# Patient Record
Sex: Female | Born: 1985 | Race: Black or African American | Hispanic: No | Marital: Single | Smoking: Never smoker
Health system: Southern US, Community
[De-identification: ages and names within clinical notes are randomized; demographics above are authoritative.]

## PROBLEM LIST (undated history)

## (undated) ENCOUNTER — Ambulatory Visit: Admission: EM | Payer: Managed Care, Other (non HMO)

## (undated) DIAGNOSIS — N809 Endometriosis, unspecified: Secondary | ICD-10-CM

## (undated) DIAGNOSIS — M069 Rheumatoid arthritis, unspecified: Secondary | ICD-10-CM

## (undated) DIAGNOSIS — J45909 Unspecified asthma, uncomplicated: Secondary | ICD-10-CM

## (undated) DIAGNOSIS — L409 Psoriasis, unspecified: Secondary | ICD-10-CM

## (undated) HISTORY — PX: DILATION AND CURETTAGE OF UTERUS: SHX78

## (undated) HISTORY — PX: ABDOMINAL SURGERY: SHX537

## (undated) HISTORY — PX: NO PAST SURGERIES: SHX2092

---

## 2016-09-12 DIAGNOSIS — L4050 Arthropathic psoriasis, unspecified: Secondary | ICD-10-CM | POA: Insufficient documentation

## 2016-09-12 DIAGNOSIS — M0579 Rheumatoid arthritis with rheumatoid factor of multiple sites without organ or systems involvement: Secondary | ICD-10-CM | POA: Insufficient documentation

## 2016-09-28 DIAGNOSIS — J4530 Mild persistent asthma, uncomplicated: Secondary | ICD-10-CM | POA: Insufficient documentation

## 2016-12-26 DIAGNOSIS — Z79899 Other long term (current) drug therapy: Secondary | ICD-10-CM | POA: Insufficient documentation

## 2017-04-02 DIAGNOSIS — F410 Panic disorder [episodic paroxysmal anxiety] without agoraphobia: Secondary | ICD-10-CM | POA: Insufficient documentation

## 2017-11-08 DIAGNOSIS — Z309 Encounter for contraceptive management, unspecified: Secondary | ICD-10-CM | POA: Insufficient documentation

## 2017-11-08 DIAGNOSIS — N912 Amenorrhea, unspecified: Secondary | ICD-10-CM | POA: Insufficient documentation

## 2017-12-09 DIAGNOSIS — R102 Pelvic and perineal pain: Secondary | ICD-10-CM | POA: Insufficient documentation

## 2019-03-03 ENCOUNTER — Ambulatory Visit
Admission: EM | Admit: 2019-03-03 | Discharge: 2019-03-03 | Disposition: A | Discharge status: Home / Self Care | Payer: Managed Care, Other (non HMO) | Attending: Family Medicine | Admitting: Family Medicine

## 2019-03-03 ENCOUNTER — Other Ambulatory Visit: Payer: Self-pay

## 2019-03-03 ENCOUNTER — Encounter: Payer: Self-pay | Admitting: Emergency Medicine

## 2019-03-03 DIAGNOSIS — R1032 Left lower quadrant pain: Secondary | ICD-10-CM | POA: Diagnosis not present

## 2019-03-03 DIAGNOSIS — R350 Frequency of micturition: Secondary | ICD-10-CM

## 2019-03-03 LAB — URINALYSIS, COMPLETE (UACMP) WITH MICROSCOPIC
Bilirubin Urine: NEGATIVE
Glucose, UA: NEGATIVE mg/dL
Hgb urine dipstick: NEGATIVE
Ketones, ur: NEGATIVE mg/dL
Leukocytes,Ua: NEGATIVE
Nitrite: NEGATIVE
Protein, ur: NEGATIVE mg/dL
Specific Gravity, Urine: 1.025 (ref 1.005–1.030)
pH: 7 (ref 5.0–8.0)

## 2019-03-03 MED ORDER — MELOXICAM 15 MG PO TABS: 15.0000 mg | ORAL_TABLET | Freq: Every day | ORAL | 0 refills | Status: DC | PRN

## 2019-03-03 NOTE — ED Provider Notes (Signed)
[facility]    CSN: [id number] Arrival date & time: 03/03/19  1200  History   Chief Complaint Chief Complaint  Patient presents with  . Urinary Frequency    APPT   HPI  33 year old female presents with urinary frequency.  Patient reports a 3-day history of urinary frequency.  Patient also reports left side pain.  Denies dysuria.  No fever.  No nausea or vomiting.  No known inciting factor.  Pain is mild currently.  No known exacerbating or relieving factors.  Patient is concerned that she may have a UTI.  No other complaints.  PMH, Surgical Hx, Family Hx, Social History reviewed and updated as below.  PMH: Arthritis (? RA), Psoriasis, Asthma, Anxiety  Past Surgical History:  Procedure Laterality Date  . NO PAST SURGERIES      OB History   No obstetric history on file.      Home Medications    Prior to Admission medications   Medication Sig Start Date End Date Taking? Authorizing Provider  albuterol (PROAIR HFA) 108 (90 Base) MCG/ACT inhaler ProAir HFA 90 mcg/actuation aerosol inhaler 11/08/17  Yes [provider]  meloxicam (MOBIC) 15 MG tablet Take 1 tablet (15 mg total) by mouth daily as needed for pain. 03/03/19   Coral Spikes, DO   Family History Family History  Problem Relation Age of Onset  . Asthma Mother   . Healthy Father    Social History Social History   Tobacco Use  . Smoking status: Never Smoker  . Smokeless tobacco: Never Used  Substance Use Topics  . Alcohol use: Not Currently  . Drug use: Not Currently     Allergies   Penicillins   Review of Systems Review of Systems  Gastrointestinal: Positive for abdominal pain.  Genitourinary: Positive for flank pain and urgency.   Physical Exam Triage Vital Signs ED Triage Vitals  Enc Vitals Group     BP 03/03/19 1216 118/85     Pulse Rate 03/03/19 1216 68     Resp 03/03/19 1216 18     Temp 03/03/19 1216 98 F (36.7 C)     Temp Source 03/03/19 1216 Oral     SpO2  03/03/19 1216 100 %     Weight 03/03/19 1212 158 lb (71.7 kg)     Height 03/03/19 1212 5\' 4"  (1.626 m)     Head Circumference --      Peak Flow --      Pain Score 03/03/19 1212 2     Pain Loc --      Pain Edu? --      Excl. in [place]? --    Updated Vital Signs BP 118/85 (BP Location: Left Arm)   Pulse 68   Temp 98 F (36.7 C) (Oral)   Resp 18   Ht 5\' 4"  (1.626 m)   Wt 71.7 kg   LMP 02/23/2019 (Approximate)   SpO2 100%   BMI 27.12 kg/m   Visual Acuity Right Eye Distance:   Left Eye Distance:   Bilateral Distance:    Right Eye Near:   Left Eye Near:    Bilateral Near:     Physical Exam Vitals signs and nursing note reviewed.  Constitutional:      General: She is not in acute distress.    Appearance: Normal appearance. She is normal weight.  HENT:     Head: Normocephalic and atraumatic.  Eyes:     General:        Right  eye: No discharge.        Left eye: No discharge.     Conjunctiva/sclera: Conjunctivae normal.  Cardiovascular:     Rate and Rhythm: Normal rate and regular rhythm.  Pulmonary:     Effort: Pulmonary effort is normal.     Breath sounds: Normal breath sounds. No wheezing, rhonchi or rales.  Abdominal:     General: There is no distension.     Palpations: Abdomen is soft.     Tenderness: There is no left CVA tenderness.     Comments: Mild tenderness in the left lower quadrant.  Neurological:     Mental Status: She is alert.  Psychiatric:        Mood and Affect: Mood normal.        Behavior: Behavior normal.    UC Treatments / Results  Labs (all labs ordered are listed, but only abnormal results are displayed) Labs Reviewed  URINALYSIS, COMPLETE (UACMP) WITH MICROSCOPIC - Abnormal; Notable for the following components:      Result Value   Bacteria, UA RARE (*)    All other components within normal limits    EKG   Radiology No results found.  Procedures Procedures (including critical care time)  Medications Ordered in UC Medications -  No data to display  Initial Impression / Assessment and Plan / UC Course  I have reviewed the triage vital signs and the nursing notes.  Pertinent labs & imaging results that were available during my care of the patient were reviewed by me and considered in my medical decision making (see chart for details).    33 year old female presents with left lower quadrant pain.  Patient also endorses urinary frequency.  Urinalysis negative.  Well-appearing.  Meloxicam as needed.  If persists, recommend ultrasound for further evaluation.  Final Clinical Impressions(s) / UC Diagnoses   Final diagnoses:  LLQ pain     Discharge Instructions     No evidence of UTI.  Rest.  Medication as needed.  If persists, recommend [place].  Take care  Dr. Lacinda Axon    ED Prescriptions    Medication Sig Dispense Auth. Provider   meloxicam (MOBIC) 15 MG tablet Take 1 tablet (15 mg total) by mouth daily as needed for pain. 30 tablet Coral Spikes, DO     Controlled Substance Prescriptions [place] Controlled Substance Registry consulted? Not Applicable   Coral Spikes, DO 03/03/19 1409

## 2019-03-03 NOTE — ED Triage Notes (Signed)
Pt c/o urinary frequency and left side pain. Started about 3 days ago. Denies dysuria or fever.

## 2019-03-03 NOTE — Discharge Instructions (Addendum)
No evidence of UTI.  Rest.  Medication as needed.  If persists, recommend [place].  Take care  Dr. Lacinda Axon

## 2019-03-21 ENCOUNTER — Ambulatory Visit
Admission: EM | Admit: 2019-03-21 | Discharge: 2019-03-21 | Disposition: A | Discharge status: Home / Self Care | Payer: Managed Care, Other (non HMO) | Attending: Emergency Medicine | Admitting: Emergency Medicine

## 2019-03-21 ENCOUNTER — Encounter: Payer: Self-pay | Admitting: Emergency Medicine

## 2019-03-21 ENCOUNTER — Other Ambulatory Visit: Payer: Self-pay

## 2019-03-21 DIAGNOSIS — N898 Other specified noninflammatory disorders of vagina: Secondary | ICD-10-CM | POA: Diagnosis not present

## 2019-03-21 DIAGNOSIS — N760 Acute vaginitis: Secondary | ICD-10-CM | POA: Diagnosis not present

## 2019-03-21 DIAGNOSIS — B9689 Other specified bacterial agents as the cause of diseases classified elsewhere: Secondary | ICD-10-CM

## 2019-03-21 LAB — URINALYSIS, COMPLETE (UACMP) WITH MICROSCOPIC
Bacteria, UA: NONE SEEN
Bilirubin Urine: NEGATIVE
Glucose, UA: NEGATIVE mg/dL
Hgb urine dipstick: NEGATIVE
Ketones, ur: NEGATIVE mg/dL
Leukocytes,Ua: NEGATIVE
Nitrite: NEGATIVE
Protein, ur: NEGATIVE mg/dL
RBC / HPF: NONE SEEN RBC/hpf (ref 0–5)
Specific Gravity, Urine: 1.025 (ref 1.005–1.030)
WBC, UA: NONE SEEN WBC/hpf (ref 0–5)
pH: 6.5 (ref 5.0–8.0)

## 2019-03-21 LAB — WET PREP, GENITAL
Sperm: NONE SEEN
Trich, Wet Prep: NONE SEEN
Yeast Wet Prep HPF POC: NONE SEEN

## 2019-03-21 LAB — PREGNANCY, URINE: Preg Test, Ur: NEGATIVE

## 2019-03-21 MED ORDER — CLINDAMYCIN PHOSPHATE 2 % VA CREA: 1.0000 | TOPICAL_CREAM | Freq: Every day | VAGINAL | 0 refills | Status: DC

## 2019-03-21 MED ORDER — KETOCONAZOLE 2 % EX CREA: 1.0000 "application " | TOPICAL_CREAM | Freq: Every day | CUTANEOUS | 0 refills | Status: DC

## 2019-03-21 MED ORDER — FLUCONAZOLE 150 MG PO TABS: ORAL_TABLET | ORAL | 0 refills | Status: DC

## 2019-03-21 NOTE — ED Triage Notes (Signed)
Patient states that she was treated for BV a week ago.  Patient c/o vaginal discharge and itching.

## 2019-03-21 NOTE — Discharge Instructions (Addendum)
If you are not improving recommend following up with OB/GYN.

## 2019-03-21 NOTE — ED Provider Notes (Signed)
[facility]    CSN: [id number] Arrival date & time: 03/21/19  1128      History   Chief Complaint Chief Complaint  Patient presents with  . Vaginal Discharge    HPI Sherry Villarreal is a 33 y.o. female.   HPI  33 year old female presents with vaginal discharge and itching that she has had since being treated for BV about a week ago.  She denies any symptoms of frequency or urgency but has noticed a whitish thick discharge with white clumps particularly after going to the bathroom.  She finished full course of Flagyl p.o. for the BV which improved but still has the symptoms as listed above.  She said no fever or chills.  She is sexually active with the same partner.  She has never had yeast infection before but feels very irritated and swollen in the genital area.         History reviewed. No pertinent past medical history.  There are no active problems to display for this patient.   Past Surgical History:  Procedure Laterality Date  . NO PAST SURGERIES      OB History   No obstetric history on file.      Home Medications    Prior to Admission medications   Medication Sig Start Date End Date Taking? Authorizing Provider  albuterol (PROAIR HFA) 108 (90 Base) MCG/ACT inhaler ProAir HFA 90 mcg/actuation aerosol inhaler 11/08/17   [provider]  clindamycin (CLEOCIN) 2 % vaginal cream Place 1 Applicatorful vaginally at bedtime. Use for 7 days 03/21/19   Lorin Picket, PA-C  fluconazole (DIFLUCAN) 150 MG tablet Take one tab for symptoms of yeast infection. Repeat x 1 in 72 hours. 03/21/19   Lorin Picket, PA-C  ketoconazole (NIZORAL) 2 % cream Apply 1 application topically daily. 03/21/19   Lorin Picket, PA-C    Family History Family History  Problem Relation Age of Onset  . Asthma Mother   . Healthy Father     Social History Social History   Tobacco Use  . Smoking status: Never Smoker  . Smokeless tobacco: Never Used   Substance Use Topics  . Alcohol use: Not Currently  . Drug use: Not Currently     Allergies   Penicillins   Review of Systems Review of Systems  Constitutional: Negative for activity change, appetite change, chills, fatigue and fever.  Genitourinary: Positive for vaginal discharge and vaginal pain. Negative for dysuria, frequency and urgency.  All other systems reviewed and are negative.    Physical Exam Triage Vital Signs ED Triage Vitals  Enc Vitals Group     BP 03/21/19 1218 103/65     Pulse Rate 03/21/19 1218 67     Resp 03/21/19 1218 14     Temp 03/21/19 1218 98.5 F (36.9 C)     Temp Source 03/21/19 1218 Oral     SpO2 03/21/19 1218 97 %     Weight 03/21/19 1214 154 lb (69.9 kg)     Height 03/21/19 1214 5\' 4"  (1.626 m)     Head Circumference --      Peak Flow --      Pain Score 03/21/19 1214 0     Pain Loc --      Pain Edu? --      Excl. in [place]? --    No data found.  Updated Vital Signs BP 103/65 (BP Location: Left Arm)   Pulse 67   Temp 98.5 F (36.9  C) (Oral)   Resp 14   Ht 5\' 4"  (1.626 m)   Wt 154 lb (69.9 kg)   LMP 02/23/2019 (Approximate)   SpO2 97%   BMI 26.43 kg/m   Visual Acuity Right Eye Distance:   Left Eye Distance:   Bilateral Distance:    Right Eye Near:   Left Eye Near:    Bilateral Near:     Physical Exam Vitals signs and nursing note reviewed.  Constitutional:      General: She is not in acute distress.    Appearance: Normal appearance. She is normal weight. She is not ill-appearing, toxic-appearing or diaphoretic.  HENT:     Head: Normocephalic and atraumatic.  Eyes:     Conjunctiva/sclera: Conjunctivae normal.  Neck:     Musculoskeletal: Normal range of motion and neck supple.  Cardiovascular:     Rate and Rhythm: Normal rate and regular rhythm.     Heart sounds: Normal heart sounds.  Pulmonary:     Effort: Pulmonary effort is normal.     Breath sounds: Normal breath sounds.  Abdominal:     General: Abdomen is  flat. Bowel sounds are normal. There is no distension.     Palpations: Abdomen is soft.     Tenderness: There is no abdominal tenderness. There is no guarding or rebound.  Genitourinary:    Comments: Patient perform self swabbing Musculoskeletal: Normal range of motion.  Skin:    General: Skin is warm and dry.  Neurological:     General: No focal deficit present.     Mental Status: She is alert and oriented to person, place, and time.  Psychiatric:        Mood and Affect: Mood normal.        Behavior: Behavior normal.        Thought Content: Thought content normal.        Judgment: Judgment normal.      UC Treatments / Results  Labs (all labs ordered are listed, but only abnormal results are displayed) Labs Reviewed  WET PREP, GENITAL - Abnormal; Notable for the following components:      Result Value   Clue Cells Wet Prep HPF POC PRESENT (*)    WBC, Wet Prep HPF POC RARE (*)    All other components within normal limits  URINALYSIS, COMPLETE (UACMP) WITH MICROSCOPIC  PREGNANCY, URINE    EKG   Radiology No results found.  Procedures Procedures (including critical care time)  Medications Ordered in UC Medications - No data to display  Initial Impression / Assessment and Plan / UC Course  I have reviewed the triage vital signs and the nursing notes.  Pertinent labs & imaging results that were available during my care of the patient were reviewed by me and considered in my medical decision making (see chart for details).   33 year old female presents with a vaginal discharge she explains as a white Rimi texture with white clumps after she urinates.  States that today while performing the self swab the alcohol caused her a great deal of pain in the genital area.  She was treated for BV about a week ago with a full course of Flagyl.  She states her symptoms have largely improved.  However wet prep today still showed clue cells present.  Her urine test was negative today.   Because of the continuing clue cells have told her we will use an intravaginal preparation, clindamycin for the next 7 days at nighttime.  In addition  I will also prescribed Diflucan for possible yeast infection and I provided her with the Nizoral cream for external use because of the irritation.  If she is not improving I recommended she follow-up with GYN.  She has an appointment for November but if she continues to have symptoms I would recommend that she see if she can move that appointment up.  Abstain from sex until after the treatment with the intravaginal clindamycin.   Final Clinical Impressions(s) / UC Diagnoses   Final diagnoses:  BV (bacterial vaginosis)  Vaginal discharge     Discharge Instructions     If you are not improving recommend following up with OB/GYN.    ED Prescriptions    Medication Sig Dispense Auth. Provider   clindamycin (CLEOCIN) 2 % vaginal cream Place 1 Applicatorful vaginally at bedtime. Use for 7 days 40 g Crecencio Mc P, PA-C   fluconazole (DIFLUCAN) 150 MG tablet Take one tab for symptoms of yeast infection. Repeat x 1 in 72 hours. 2 tablet Crecencio Mc P, PA-C   ketoconazole (NIZORAL) 2 % cream Apply 1 application topically daily. 15 g Lorin Picket, PA-C     PDMP not reviewed this encounter.   Lorin Picket, PA-C 03/21/19 1720

## 2019-04-16 ENCOUNTER — Emergency Department: Payer: Managed Care, Other (non HMO)

## 2019-04-16 ENCOUNTER — Encounter: Payer: Self-pay | Admitting: Emergency Medicine

## 2019-04-16 ENCOUNTER — Other Ambulatory Visit: Payer: Self-pay

## 2019-04-16 ENCOUNTER — Emergency Department
Admission: EM | Admit: 2019-04-16 | Discharge: 2019-04-16 | Disposition: A | Discharge status: Home / Self Care | Payer: Managed Care, Other (non HMO) | Attending: Emergency Medicine | Admitting: Emergency Medicine

## 2019-04-16 DIAGNOSIS — R102 Pelvic and perineal pain: Secondary | ICD-10-CM | POA: Diagnosis present

## 2019-04-16 DIAGNOSIS — N946 Dysmenorrhea, unspecified: Secondary | ICD-10-CM | POA: Diagnosis not present

## 2019-04-16 LAB — COMPREHENSIVE METABOLIC PANEL
ALT: 15 U/L (ref 0–44)
AST: 16 U/L (ref 15–41)
Albumin: 4.4 g/dL (ref 3.5–5.0)
Alkaline Phosphatase: 47 U/L (ref 38–126)
Anion gap: 9 (ref 5–15)
BUN: 11 mg/dL (ref 6–20)
CO2: 27 mmol/L (ref 22–32)
Calcium: 9.3 mg/dL (ref 8.9–10.3)
Chloride: 101 mmol/L (ref 98–111)
Creatinine, Ser: 0.65 mg/dL (ref 0.44–1.00)
GFR calc Af Amer: >60 mL/min (ref >60)
GFR calc non Af Amer: >60 mL/min (ref >60)
Glucose, Bld: 92 mg/dL (ref 70–99)
Potassium: 3.7 mmol/L (ref 3.5–5.1)
Sodium: 137 mmol/L (ref 135–145)
Total Bilirubin: 0.9 mg/dL (ref 0.3–1.2)
Total Protein: 7.6 g/dL (ref 6.5–8.1)

## 2019-04-16 LAB — POCT PREGNANCY, URINE: Preg Test, Ur: NEGATIVE

## 2019-04-16 LAB — URINALYSIS, COMPLETE (UACMP) WITH MICROSCOPIC
Bacteria, UA: NONE SEEN
Bilirubin Urine: NEGATIVE
Glucose, UA: NEGATIVE mg/dL
Ketones, ur: NEGATIVE mg/dL
Leukocytes,Ua: NEGATIVE
Nitrite: NEGATIVE
Protein, ur: NEGATIVE mg/dL
Specific Gravity, Urine: 1.026 (ref 1.005–1.030)
WBC, UA: NONE SEEN WBC/hpf (ref 0–5)
pH: 5 (ref 5.0–8.0)

## 2019-04-16 LAB — CBC
HCT: 37.1 % (ref 36.0–46.0)
Hemoglobin: 12.1 g/dL (ref 12.0–15.0)
MCH: 29.7 pg (ref 26.0–34.0)
MCHC: 32.6 g/dL (ref 30.0–36.0)
MCV: 91.2 fL (ref 80.0–100.0)
Platelets: 269 10*3/uL (ref 150–400)
RBC: 4.07 MIL/uL (ref 3.87–5.11)
RDW: 13.3 % (ref 11.5–15.5)
WBC: 10.6 10*3/uL — ABNORMAL HIGH (ref 4.0–10.5)
nRBC: 0 % (ref 0.0–0.2)

## 2019-04-16 LAB — LIPASE, BLOOD: Lipase: 30 U/L (ref 11–51)

## 2019-04-16 LAB — WET PREP, GENITAL
Sperm: NONE SEEN
Trich, Wet Prep: NONE SEEN
Yeast Wet Prep HPF POC: NONE SEEN

## 2019-04-16 MED ORDER — DIPHENHYDRAMINE HCL 25 MG PO CAPS
25.0000 mg | ORAL_CAPSULE | Freq: Once | ORAL | Status: AC
  Administered 2019-04-16: 25 mg via ORAL

## 2019-04-16 MED ORDER — MORPHINE SULFATE (PF) 4 MG/ML IV SOLN
4.0000 mg | Freq: Once | INTRAVENOUS | Status: DC
  Filled 2019-04-16: qty 1

## 2019-04-16 MED ORDER — SODIUM CHLORIDE 0.9% FLUSH
3.0000 mL | Freq: Once | INTRAVENOUS | Status: AC
  Administered 2019-04-16: 3 mL via INTRAVENOUS

## 2019-04-16 MED ORDER — OXYCODONE-ACETAMINOPHEN 5-325 MG PO TABS
1.0000 | ORAL_TABLET | Freq: Once | ORAL | Status: AC
  Administered 2019-04-16: 1 via ORAL
  Filled 2019-04-16: qty 1

## 2019-04-16 MED ORDER — DIPHENHYDRAMINE HCL 25 MG PO CAPS
ORAL_CAPSULE | ORAL | Status: AC
  Filled 2019-04-16: qty 1

## 2019-04-16 NOTE — ED Provider Notes (Signed)
[facility] Emergency Department Provider Note   ____________________________________________   First MD Initiated Contact with Patient 04/16/19 1823     (approximate)  I have reviewed the triage vital signs and the nursing notes.   HISTORY  Chief Complaint Abdominal Pain    HPI Sherry Villarreal is a 33 y.o. female with no significant past medical history who presents to the ED complaining of pelvic pain.  Patient reports she has had 1 to 2 days of increasing pain in her pelvic area.  She describes this as a throbbing that is not exacerbated or alleviated by anything.  She denies any associated vaginal discharge, but has begun to have bleeding which she associates with the onset of her menstrual period.  She denies any dysuria, hematuria, flank pain, diarrhea, vomiting, or fever.  She does state that her symptoms are worse when she goes to have a bowel movement.  She was recently treated for BV on 2 occasions and has been scheduled for ultrasound by her OB/GYN.  She spoke with her OB/GYN's office today, who referred her to the ED for further evaluation.        History reviewed. No pertinent past medical history.  There are no active problems to display for this patient.   Past Surgical History:  Procedure Laterality Date  . DILATION AND CURETTAGE OF UTERUS    . NO PAST SURGERIES      Prior to Admission medications   Medication Sig Start Date End Date Taking? Authorizing Provider  albuterol (PROAIR HFA) 108 (90 Base) MCG/ACT inhaler ProAir HFA 90 mcg/actuation aerosol inhaler 11/08/17   [provider]  clindamycin (CLEOCIN) 2 % vaginal cream Place 1 Applicatorful vaginally at bedtime. Use for 7 days 03/21/19   Lorin Picket, PA-C  fluconazole (DIFLUCAN) 150 MG tablet Take one tab for symptoms of yeast infection. Repeat x 1 in 72 hours. 03/21/19   Lorin Picket, PA-C  ketoconazole (NIZORAL) 2 % cream Apply 1 application topically daily.  03/21/19   Lorin Picket, PA-C    Allergies Penicillins  Family History  Problem Relation Age of Onset  . Asthma Mother   . Healthy Father     Social History Social History   Tobacco Use  . Smoking status: Never Smoker  . Smokeless tobacco: Never Used  Substance Use Topics  . Alcohol use: Not Currently  . Drug use: Not Currently    Review of Systems  Constitutional: No fever/chills Eyes: No visual changes. ENT: No sore throat. Cardiovascular: Denies chest pain. Respiratory: Denies shortness of breath. Gastrointestinal: No abdominal pain.  No nausea, no vomiting.  No diarrhea.  No constipation. Genitourinary: Negative for dysuria.  Positive for pelvic pain.  Positive for vaginal bleeding. Musculoskeletal: Negative for back pain. Skin: Negative for rash. Neurological: Negative for headaches, focal weakness or numbness.  ____________________________________________   PHYSICAL EXAM:  VITAL SIGNS: ED Triage Vitals  Enc Vitals Group     BP 04/16/19 1748 124/62     Pulse Rate 04/16/19 1748 66     Resp 04/16/19 1748 18     Temp 04/16/19 1748 98.9 F (37.2 C)     Temp Source 04/16/19 1748 Oral     SpO2 04/16/19 1748 100 %     Weight --      Height 04/16/19 1736 5\' 4"  (1.626 m)     Head Circumference --      Peak Flow --      Pain Score 04/16/19 1736  7     Pain Loc --      Pain Edu? --      Excl. in [place]? --     Constitutional: Alert and oriented. Eyes: Conjunctivae are normal. Head: Atraumatic. Nose: No congestion/rhinnorhea. Mouth/Throat: Mucous membranes are moist. Neck: Normal ROM Cardiovascular: Normal rate, regular rhythm. Grossly normal heart sounds. Respiratory: Normal respiratory effort.  No retractions. Lungs CTAB. Gastrointestinal: Soft and nontender. No distention. Genitourinary: Small amount of blood in the vaginal vault, cervix unremarkable.  Right adnexal tenderness noted, otherwise no cervical motion or left adnexal tenderness.  Musculoskeletal: No lower extremity tenderness nor edema. Neurologic:  Normal speech and language. No gross focal neurologic deficits are appreciated. Skin:  Skin is warm, dry and intact. No rash noted. Psychiatric: Mood and affect are normal. Speech and behavior are normal.  ____________________________________________   LABS (all labs ordered are listed, but only abnormal results are displayed)  Labs Reviewed  WET PREP, GENITAL - Abnormal; Notable for the following components:      Result Value   Clue Cells Wet Prep HPF POC PRESENT (*)    WBC, Wet Prep HPF POC FEW (*)    All other components within normal limits  CBC - Abnormal; Notable for the following components:   WBC 10.6 (*)    All other components within normal limits  URINALYSIS, COMPLETE (UACMP) WITH MICROSCOPIC - Abnormal; Notable for the following components:   Color, Urine YELLOW (*)    APPearance CLEAR (*)    Hgb urine dipstick SMALL (*)    All other components within normal limits  LIPASE, BLOOD  COMPREHENSIVE METABOLIC PANEL  POC URINE PREG, ED  POCT PREGNANCY, URINE    PROCEDURES  Procedure(s) performed (including Critical Care):  Procedures   ____________________________________________   INITIAL IMPRESSION / ASSESSMENT AND PLAN / ED COURSE       33 year old female presenting to the ED for worsening pelvic pain over the past 1 to 2 days occurring with the onset of her menses and exacerbated with bowel movements.  She has no abdominal tenderness noted on exam, doubt appendicitis or other acute surgical pathology.  Pelvic exam shows small amount of blood consistent with menses, no discharge noted.  She does have some right adnexal tenderness, but no cervical motion or left adnexal tenderness.  Per prior note from her OB/GYN, this is similar to her prior exam.  Ultrasound was obtained and negative for fibroids or other acute process, no evidence of torsion.  Given chronicity of symptoms, doubt intermittent  torsion.  Given her description as well as association with bowel movements, she would benefit from further work-up for endometriosis.  Counseled patient to use Tylenol and ibuprofen as needed, follow-up with her OB/GYN, and return to the ED for new or worsening symptoms.  Patient agrees with plan.      ____________________________________________   FINAL CLINICAL IMPRESSION(S) / ED DIAGNOSES  Final diagnoses:  Pelvic pain  Dysmenorrhea     ED Discharge Orders    None       Note:  This document was prepared using Dragon voice recognition software and may include unintentional dictation errors.   Blake Divine, MD 04/16/19 [phone]

## 2019-04-16 NOTE — ED Notes (Signed)
Po pain meds given for pt.  Pt reports itching with percocet.  md aware.  Benadryl given with percocet.  Pt alert and texting on phone.

## 2019-04-16 NOTE — ED Notes (Signed)
Pt signed esignature.  D/c  inst to pt.  

## 2019-04-16 NOTE — ED Triage Notes (Signed)
First RN Note: Pt presents to ED via POV, states having lower abdominal pain and was referred by OBGYN. Per notes from OBGYN notes, pt having pelvic pain and feeling like she is constipated, is unable to put a tampon in due to pelvic pain.

## 2019-04-16 NOTE — ED Notes (Signed)
REPORTS lower abdominal pain that started today-reports hx of same. Denies NVD. No increased tenderness with palpation. Pt alert and oriented X4, cooperative, RR even and unlabored, color WNL. Pt in NAD.

## 2019-04-16 NOTE — ED Notes (Signed)
Patient transported to Ultrasound 

## 2019-06-23 ENCOUNTER — Encounter: Payer: Self-pay | Admitting: Emergency Medicine

## 2019-06-23 ENCOUNTER — Ambulatory Visit
Admission: EM | Admit: 2019-06-23 | Discharge: 2019-06-23 | Disposition: A | Discharge status: Home / Self Care | Payer: Managed Care, Other (non HMO) | Attending: Emergency Medicine | Admitting: Emergency Medicine

## 2019-06-23 ENCOUNTER — Other Ambulatory Visit: Payer: Self-pay

## 2019-06-23 DIAGNOSIS — R110 Nausea: Secondary | ICD-10-CM

## 2019-06-23 DIAGNOSIS — R0981 Nasal congestion: Secondary | ICD-10-CM

## 2019-06-23 DIAGNOSIS — Z760 Encounter for issue of repeat prescription: Secondary | ICD-10-CM | POA: Insufficient documentation

## 2019-06-23 DIAGNOSIS — J45909 Unspecified asthma, uncomplicated: Secondary | ICD-10-CM | POA: Insufficient documentation

## 2019-06-23 DIAGNOSIS — Z20822 Contact with and (suspected) exposure to covid-19: Secondary | ICD-10-CM | POA: Insufficient documentation

## 2019-06-23 DIAGNOSIS — R519 Headache, unspecified: Secondary | ICD-10-CM

## 2019-06-23 DIAGNOSIS — R05 Cough: Secondary | ICD-10-CM | POA: Diagnosis not present

## 2019-06-23 MED ORDER — IBUPROFEN 600 MG PO TABS: 600.0000 mg | ORAL_TABLET | Freq: Four times a day (QID) | ORAL | 0 refills | Status: DC | PRN

## 2019-06-23 MED ORDER — FLUTICASONE PROPIONATE 50 MCG/ACT NA SUSP: 2.0000 | Freq: Every day | NASAL | 0 refills | Status: DC

## 2019-06-23 MED ORDER — AEROCHAMBER PLUS MISC: 2 refills | Status: AC

## 2019-06-23 MED ORDER — ALBUTEROL SULFATE HFA 108 (90 BASE) MCG/ACT IN AERS: 1.0000 | INHALATION_SPRAY | Freq: Four times a day (QID) | RESPIRATORY_TRACT | 0 refills | Status: AC | PRN

## 2019-06-23 MED ORDER — MONTELUKAST SODIUM 10 MG PO TABS: 10.0000 mg | ORAL_TABLET | Freq: Every day | ORAL | 1 refills | Status: DC

## 2019-06-23 NOTE — Discharge Instructions (Addendum)
Covid PCR sent-we will return in 18 to 24 hours.  Start Singulair, take regular albuterol for the next 4 days with your spacer.  2 puffs every 4 hours for 2 days, then every 6 hours for 2 days, then as needed.  Ibuprofen 600 mg combined with 1 g of Tylenol for headache.  Push electrolyte containing fluids such as Pedialyte or Gatorade.  Flonase, saline nasal irrigation with a Milta Deiters med sinus rinse and distilled water as often as you want as needed for the nasal congestion.

## 2019-06-23 NOTE — ED Triage Notes (Signed)
Pt c/o headache, nasal congestion, cough. Started about 2 days ago. She was exposed to covid about 12 days ago then again about a week ago.

## 2019-06-23 NOTE — ED Provider Notes (Signed)
HPI  SUBJECTIVE:  Sherry Villarreal is a 34 y.o. female who presents for Covid testing. She reports 2 days of headache, clear nasal congestion, rhinorrhea, fatigue, cough, mild shortness of breath, nausea. She reports chest tightness starting earlier today. Some photophobia. No sinus pain or pressure, neck stiffness, rash. She states that her allergies are bothering her. No fevers, body aches, sore throat, loss of sense of taste or smell, wheezing. No vomiting, diarrhea, abdominal pain. She was last exposed to Covid 1 week ago today. No aggravating or alleviating factors. She has not tried anything for this. She did not get a flu shot this year. She is currently on MetroGel for BV. No other recent antibiotics. No antipyretic in the past 4 to 6 hours. She has a past medical history of asthma, allergies, frequent sinusitis. She is not a smoker. She is not taking anything for her allergies. She  has run out of her Singulair and albuterol. She has a past medical history of psoriasis and rheumatoid arthritis currently on methotrexate. No history of diabetes, coronary disease, hypertension, chronic kidney disease, cancer, HIV. LMP: Denies the possibility of being pregnant. PMD: Dr. Gayla Medicus.   History reviewed. No pertinent past medical history.  Past Surgical History:  Procedure Laterality Date  . DILATION AND CURETTAGE OF UTERUS    . NO PAST SURGERIES      Family History  Problem Relation Age of Onset  . Asthma Mother   . Healthy Father     Social History   Tobacco Use  . Smoking status: Never Smoker  . Smokeless tobacco: Never Used  Substance Use Topics  . Alcohol use: Not Currently  . Drug use: Not Currently    No current facility-administered medications for this encounter.  Current Outpatient Medications:  .  folic acid (FOLVITE) 1 MG tablet, Take 1 mg by mouth daily., Disp: , Rfl:  .  methotrexate (RHEUMATREX) 2.5 MG tablet, methotrexate sodium 2.5 mg tablet, Disp: , Rfl:  .   albuterol (VENTOLIN HFA) 108 (90 Base) MCG/ACT inhaler, Inhale 1-2 puffs into the lungs every 6 (six) hours as needed for wheezing or shortness of breath., Disp: 18 g, Rfl: 0 .  fluticasone (FLONASE) 50 MCG/ACT nasal spray, Place 2 sprays into both nostrils daily., Disp: 16 g, Rfl: 0 .  ibuprofen (ADVIL) 600 MG tablet, Take 1 tablet (600 mg total) by mouth every 6 (six) hours as needed., Disp: 30 tablet, Rfl: 0 .  montelukast (SINGULAIR) 10 MG tablet, Take 1 tablet (10 mg total) by mouth at bedtime., Disp: 30 tablet, Rfl: 1 .  Spacer/Aero-Holding Chambers (AEROCHAMBER PLUS) inhaler, Use as instructed, Disp: 1 each, Rfl: 2  Allergies  Allergen Reactions  . Penicillins Other (See Comments) and Rash    Sores on scalp      ROS  As noted in HPI.   Physical Exam  BP 109/68 (BP Location: Left Arm)   Pulse 71   Temp 98.4 F (36.9 C) (Oral)   Resp 18   Ht 5\' 4"  (1.626 m)   Wt 68.5 kg   LMP 06/09/2019   SpO2 100%   BMI 25.92 kg/m   Constitutional: Well developed, well nourished, no acute distress Eyes:  EOMI, conjunctiva normal bilaterally HENT: Normocephalic, atraumatic,mucus membranes moist.  TMs normal bilaterally.  Erythematous, swollen turbinates.  No nasal congestion.  No maxillary or frontal sinus tenderness.  Normal oropharynx.  Enlarged tonsils without erythema or exudates.  Uvula midline. Neck: No cervical lymphadenopathy, meningismus Respiratory: Normal inspiratory effort, lungs  clear bilaterally.  Good air movement.  No chest wall tenderness. Cardiovascular: Normal rate regular rhythm no murmurs rubs or gallops GI: nondistended skin: No rash, skin intact Musculoskeletal: no deformities Neurologic: Alert & oriented x 3, no focal neuro deficits Psychiatric: Speech and behavior appropriate   ED Course   Medications - No data to display  Orders Placed This Encounter  Procedures  . Novel Coronavirus, NAA (Hosp order, Send-out to Ref Lab; TAT 18-24 hrs    Standing  Status:   Standing    Number of Occurrences:   1    Order Specific Question:   Is this test for diagnosis or screening    Answer:   Diagnosis of ill patient    Order Specific Question:   Symptomatic for COVID-19 as defined by CDC    Answer:   Yes    Order Specific Question:   Date of Symptom Onset    Answer:   06/21/2019    Order Specific Question:   Hospitalized for COVID-19    Answer:   Yes    Order Specific Question:   Admitted to ICU for COVID-19    Answer:   No    Order Specific Question:   Previously tested for COVID-19    Answer:   Yes    Order Specific Question:   Resident in a congregate (group) care setting    Answer:   No    Order Specific Question:   Employed in healthcare setting    Answer:   No    Order Specific Question:   Pregnant    Answer:   No    No results found for this or any previous visit (from the past 24 hour(s)). No results found.  ED Clinical Impression  1. Suspected COVID-19 virus infection   2. Uncomplicated asthma, unspecified asthma severity, unspecified whether persistent   3. Medication refill      ED Assessment/Plan  Patient with Covid exposure.  Suspect Covid versus URI.  Covid PCR sent.  Asthma also seems to be bothering her.  Will refill her Singulair, albuterol and prescribe her spacer.  She is to do regularly scheduled albuterol for the next 4 days, 2 puffs every 4 hours for 2 days, then every 6 hours for 2 days, then as needed.  Ibuprofen 600 mg combined with 1 g of Tylenol for headache.  Push electrolyte containing fluids.  Flonase, saline nasal irrigation as needed for the nasal congestion.  Follow-up with PMD as needed, to the ER if she gets worse.  Discussed MDM, treatment plan, and plan for follow-up with patient. Discussed sn/sx that should prompt return to the ED. patient agrees with plan.   Meds ordered this encounter  Medications  . montelukast (SINGULAIR) 10 MG tablet    Sig: Take 1 tablet (10 mg total) by mouth at bedtime.     Dispense:  30 tablet    Refill:  1  . fluticasone (FLONASE) 50 MCG/ACT nasal spray    Sig: Place 2 sprays into both nostrils daily.    Dispense:  16 g    Refill:  0  . Spacer/Aero-Holding Chambers (AEROCHAMBER PLUS) inhaler    Sig: Use as instructed    Dispense:  1 each    Refill:  2  . albuterol (VENTOLIN HFA) 108 (90 Base) MCG/ACT inhaler    Sig: Inhale 1-2 puffs into the lungs every 6 (six) hours as needed for wheezing or shortness of breath.    Dispense:  18 g  Refill:  0  . ibuprofen (ADVIL) 600 MG tablet    Sig: Take 1 tablet (600 mg total) by mouth every 6 (six) hours as needed.    Dispense:  30 tablet    Refill:  0    *This clinic note was created using [employer]. Therefore, there may be occasional mistakes despite careful proofreading.   ?    Melynda Ripple, MD 06/23/19 2012

## 2019-06-24 LAB — NOVEL CORONAVIRUS, NAA (HOSP ORDER, SEND-OUT TO REF LAB; TAT 18-24 HRS): SARS-CoV-2, NAA: NOT DETECTED

## 2019-10-16 DIAGNOSIS — G8929 Other chronic pain: Secondary | ICD-10-CM | POA: Insufficient documentation

## 2019-11-05 DIAGNOSIS — M542 Cervicalgia: Secondary | ICD-10-CM | POA: Insufficient documentation

## 2019-11-06 ENCOUNTER — Other Ambulatory Visit: Payer: Self-pay

## 2019-11-06 ENCOUNTER — Ambulatory Visit (INDEPENDENT_AMBULATORY_CARE_PROVIDER_SITE_OTHER): Payer: Managed Care, Other (non HMO) | Admitting: Podiatry

## 2019-11-06 DIAGNOSIS — L600 Ingrowing nail: Secondary | ICD-10-CM

## 2019-11-06 MED ORDER — GENTAMICIN SULFATE 0.1 % EX CREA: 1.0000 "application " | TOPICAL_CREAM | Freq: Two times a day (BID) | CUTANEOUS | 1 refills | Status: DC

## 2019-11-06 MED ORDER — DIAZEPAM 10 MG PO TABS: 10.0000 mg | ORAL_TABLET | Freq: Two times a day (BID) | ORAL | 0 refills | Status: DC | PRN

## 2019-11-06 NOTE — Patient Instructions (Signed)

## 2019-11-09 NOTE — Progress Notes (Signed)
   Subjective: Patient presents today for evaluation of gradually worsening pain to the medial border of the bilateral great toes that began about 6 months ago. Patient is concerned for possible ingrown nail and reports a h/o of them two years ago. Walking increases the pain. She has been trimming the nails and getting pedicures for treatment. Patient presents today for further treatment and evaluation.  No past medical history on file.  Objective:  General: Well developed, nourished, in no acute distress, alert and oriented x3   Dermatology: Skin is warm, dry and supple bilateral. Medial borders of the bilateral great toes appears to be erythematous with evidence of an ingrowing nail. Pain on palpation noted to the border of the nail fold. The remaining nails appear unremarkable at this time. There are no open sores, lesions.  Vascular: Dorsalis Pedis artery and Posterior Tibial artery pedal pulses palpable. No lower extremity edema noted.   Neruologic: Grossly intact via light touch bilateral.  Musculoskeletal: Muscular strength within normal limits in all groups bilateral. Normal range of motion noted to all pedal and ankle joints.   Assesement: #1 Paronychia with ingrowing nail medial borders bilateral great toes  #2 Pain in toe #3 Incurvated nail  Plan of Care:  1. Patient evaluated.  2. Discussed treatment alternatives and plan of care. Explained nail avulsion procedure and post procedure course to patient. 3. Patient opted for permanent partial nail avulsion of the medial border of the left hallux.  4. Prior to procedure, local anesthesia infiltration utilized using 3 ml of a 50:50 mixture of 2% plain lidocaine and 0.5% plain marcaine in a normal hallux block fashion and a betadine prep performed.  5. Partial permanent nail avulsion with chemical matrixectomy performed using [id number] applications of phenol followed by alcohol flush.  6. Light dressing applied. 7. Prescription for  Gentamicin cream provided to patient to use daily with a bandage.  8. Prescription for Valium 10 mg provided to patient to take just prior to next appointment.  9. Return to clinic in 2 weeks to address medial border right hallux.  Patient has high anxiety.   Edrick Kins, DPM [facility]  Dr. Edrick Kins, [address]                                        [place], [place] [address]                Office [phone]  Fax [phone]

## 2019-11-24 ENCOUNTER — Ambulatory Visit (INDEPENDENT_AMBULATORY_CARE_PROVIDER_SITE_OTHER): Payer: Managed Care, Other (non HMO) | Admitting: Podiatry

## 2019-11-24 ENCOUNTER — Other Ambulatory Visit: Payer: Self-pay

## 2019-11-24 ENCOUNTER — Encounter: Payer: Self-pay | Admitting: Podiatry

## 2019-11-24 DIAGNOSIS — L600 Ingrowing nail: Secondary | ICD-10-CM | POA: Diagnosis not present

## 2019-11-24 NOTE — Progress Notes (Signed)
   Subjective: Patient presents today for evaluation of pain to the medial border of the right great toe.  Patient was last seen here in the office approximately 2 weeks ago where she had partial permanent nail avulsion performed to the medial border of the left great toe.  She states that is doing very well.  Minimal pain.  She presents today to now address her ingrown toenail to the right great toe. Patient does have high anxiety and took a prescribed Valium 10 mg pill approximately 10 minutes prior to her office visit today.  No past medical history on file.  Objective:  General: Well developed, nourished, in no acute distress, alert and oriented x3   Dermatology: Skin is warm, dry and supple bilateral.  Medial border right great toe appears to be erythematous with evidence of an ingrowing nail. Pain on palpation noted to the border of the nail fold. The remaining nails appear unremarkable at this time. There are no open sores, lesions.  The medial border of the left great toe appears to be healing appropriately.  Negative for any significant drainage.  Negative for any edema or erythema around the area.  Vascular: Dorsalis Pedis artery and Posterior Tibial artery pedal pulses palpable. No lower extremity edema noted.   Neruologic: Grossly intact via light touch bilateral.  Musculoskeletal: Muscular strength within normal limits in all groups bilateral. Normal range of motion noted to all pedal and ankle joints.   Assesement: #1 Paronychia with ingrowing nail medial border right great toe #2  Status post partial permanent nail avulsion medial border left great toe.  Date of procedure: 10/07/2019  Plan of Care:  1. Patient evaluated.  2. Discussed treatment alternatives and plan of care. Explained nail avulsion procedure and post procedure course to patient. 3. Patient opted for permanent partial nail avulsion of the medial border right great toe.  4. Prior to procedure, local anesthesia  infiltration utilized using 3 ml of a 50:50 mixture of 2% plain lidocaine and 0.5% plain marcaine in a normal hallux block fashion and a betadine prep performed.  5. Partial permanent nail avulsion with chemical matrixectomy performed using [id number] applications of phenol followed by alcohol flush.  6. Light dressing applied. 7. Return to clinic 2 weeks.  Edrick Kins, DPM [facility]  Dr. Edrick Kins, [address]                                        [place], [place] [address]                Office [phone]  Fax [phone]

## 2019-12-15 ENCOUNTER — Ambulatory Visit: Payer: Managed Care, Other (non HMO) | Admitting: Podiatry

## 2020-01-23 ENCOUNTER — Ambulatory Visit: Payer: Self-pay

## 2020-01-23 ENCOUNTER — Other Ambulatory Visit: Payer: Self-pay

## 2020-01-23 ENCOUNTER — Encounter: Payer: Self-pay | Admitting: Emergency Medicine

## 2020-01-23 ENCOUNTER — Ambulatory Visit
Admission: EM | Admit: 2020-01-23 | Discharge: 2020-01-23 | Disposition: A | Discharge status: Home / Self Care | Payer: Managed Care, Other (non HMO) | Attending: Physician Assistant | Admitting: Physician Assistant

## 2020-01-23 DIAGNOSIS — R059 Cough, unspecified: Secondary | ICD-10-CM

## 2020-01-23 DIAGNOSIS — J392 Other diseases of pharynx: Secondary | ICD-10-CM

## 2020-01-23 DIAGNOSIS — Z1152 Encounter for screening for COVID-19: Secondary | ICD-10-CM

## 2020-01-23 DIAGNOSIS — R0981 Nasal congestion: Secondary | ICD-10-CM

## 2020-01-23 HISTORY — DX: Rheumatoid arthritis, unspecified: M06.9

## 2020-01-23 HISTORY — DX: Unspecified asthma, uncomplicated: J45.909

## 2020-01-23 HISTORY — DX: Psoriasis, unspecified: L40.9

## 2020-01-23 MED ORDER — BENZONATATE 200 MG PO CAPS: 200.0000 mg | ORAL_CAPSULE | Freq: Three times a day (TID) | ORAL | 0 refills | Status: DC

## 2020-01-23 MED ORDER — AZELASTINE HCL 0.1 % NA SOLN: 2.0000 | Freq: Two times a day (BID) | NASAL | 0 refills | Status: DC

## 2020-01-23 MED ORDER — LIDOCAINE VISCOUS HCL 2 % MT SOLN: OROMUCOSAL | 0 refills | Status: DC

## 2020-01-23 NOTE — ED Provider Notes (Signed)
[facility]    CSN: [id number] Arrival date & time: 01/23/20  1511      History   Chief Complaint Chief Complaint  Patient presents with  . Appointment    1500  . URI    HPI Sherry Villarreal is a 34 y.o. female.   34 year old female comes in for 6 day of URI symptoms. Cough, nasal congestion, sore throat, body aches. Denies fever, chills. Denies abdominal pain, nausea, vomiting, diarrhea. Denies loss of taste/smell. Shortness of breath with chest tightness. No COVID vaccine.     Past Medical History:  Diagnosis Date  . Asthma   . Psoriasis   . Rheumatoid arthritis [facility])     Patient Active Problem List   Diagnosis Date Noted  . Neck pain 11/05/2019  . Chronic pelvic pain in female 10/16/2019  . Suprapubic pain 12/09/2017  . Amenorrhea 11/08/2017  . Encounter for contraceptive management 11/08/2017  . Panic attacks 04/02/2017  . High risk medication use 12/26/2016  . Mild persistent asthma without complication 65/08/5463  . 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 Rheumatoid arthritis involving multiple sites with positive rheumatoid factor ([place]) 09/12/2016    Past Surgical History:  Procedure Laterality Date  . DILATION AND CURETTAGE OF UTERUS    . NO PAST SURGERIES      OB History   No obstetric history on file.      Home Medications    Prior to Admission medications   Medication Sig Start Date End Date Taking? Authorizing Provider  albuterol (VENTOLIN HFA) 108 (90 Base) MCG/ACT inhaler Inhale 1-2 puffs into the lungs every 6 (six) hours as needed for wheezing or shortness of breath. 06/23/19   Melynda Ripple, MD  azelastine (ASTELIN) 0.1 % nasal spray Place 2 sprays into both nostrils 2 (two) times daily. 01/23/20   Tasia Catchings, Malania Gawthrop V, PA-C  benzonatate (TESSALON) 200 MG capsule Take 1 capsule (200 mg total) by mouth every 8 (eight) hours. 01/23/20   Tasia Catchings, Daveah Varone V, PA-C  buPROPion (WELLBUTRIN XL) 150 MG 24 hr tablet bupropion HCl XL 150 mg 24 hr  tablet, extended release    [provider]  buPROPion (WELLBUTRIN XL) 300 MG 24 hr tablet bupropion HCl XL 300 mg 24 hr tablet, extended release    [provider]  clobetasol ointment (TEMOVATE) 0.05 % clobetasol 0.05 % topical ointment    [provider]  Clocortolone Pivalate (CLODERM) 0.1 % cream Cloderm 0.1 % topical cream    [provider]  cyclobenzaprine (FLEXERIL) 10 MG tablet cyclobenzaprine 10 mg tablet  Take 1 tablet every day by oral route at bedtime for 30 days.    [provider]  Desoximetasone (TOPICORT) 0.25 % LIQD Topicort 0.25 % topical spray    [provider]  diazepam (VALIUM) 10 MG tablet Take 1 tablet (10 mg total) by mouth every 12 (twelve) hours as needed for anxiety (Take prior to next appt). 11/06/19   Edrick Kins, DPM  famotidine (PEPCID) 40 MG tablet famotidine 40 mg tablet    [provider]  fluticasone (FLONASE) 50 MCG/ACT nasal spray Place 2 sprays into both nostrils daily. 06/23/19   Melynda Ripple, MD  folic acid (FOLVITE) 1 MG tablet Take 1 mg by mouth daily.    [provider]  gentamicin cream (GARAMYCIN) 0.1 % Apply 1 application topically 2 (two) times daily. 11/06/19   Edrick Kins, DPM  ibuprofen (ADVIL) 600 MG tablet Take 1 tablet (600 mg total)  by mouth every 6 (six) hours as needed. 06/23/19   Melynda Ripple, MD  lidocaine (XYLOCAINE) 2 % solution 5-15 mL gurgle as needed 01/23/20   Tasia Catchings, Sameera Betton V, PA-C  medroxyPROGESTERone (DEPO-PROVERA) 150 MG/ML injection medroxyprogesterone 150 mg/mL intramuscular suspension  INJECT 1 MILLILITER INTRAMUSCULARLY EVERY 3 MONTHS    [provider]  methotrexate (RHEUMATREX) 2.5 MG tablet methotrexate sodium 2.5 mg tablet 07/17/18   [provider]  montelukast (SINGULAIR) 10 MG tablet Take 1 tablet (10 mg total) by mouth at bedtime. 06/23/19   Melynda Ripple, MD  naproxen (NAPROSYN) 500 MG tablet naproxen 500 mg tablet     [provider]  Spacer/Aero-Holding Chambers (AEROCHAMBER PLUS) inhaler Use as instructed 06/23/19   Melynda Ripple, MD  sulfaSALAzine (AZULFIDINE) 500 MG tablet sulfasalazine 500 mg tablet  Take 1 tablet twice a day by oral route for 30 days.  01/23/20  [provider]    Family History Family History  Problem Relation Age of Onset  . Asthma Mother   . Healthy Father     Social History Social History   Tobacco Use  . Smoking status: Never Smoker  . Smokeless tobacco: Never Used  Vaping Use  . Vaping Use: Never used  Substance Use Topics  . Alcohol use: Not Currently  . Drug use: Not Currently     Allergies   Penicillins   Review of Systems Review of Systems  Reason unable to perform ROS: See HPI as above.     Physical Exam Triage Vital Signs ED Triage Vitals  Enc Vitals Group     BP 01/23/20 1529 114/75     Pulse Rate 01/23/20 1529 99     Resp 01/23/20 1529 18     Temp 01/23/20 1529 98.7 F (37.1 C)     Temp Source 01/23/20 1529 Oral     SpO2 01/23/20 1529 98 %     Weight --      Height --      Head Circumference --      Peak Flow --      Pain Score 01/23/20 1530 5     Pain Loc --      Pain Edu? --      Excl. in [place]? --    No data found.  Updated Vital Signs BP 114/75 (BP Location: Left Arm)   Pulse 99   Temp 98.7 F (37.1 C) (Oral)   Resp 18   SpO2 98%   Physical Exam Constitutional:      General: She is not in acute distress.    Appearance: Normal appearance. She is well-developed. She is not ill-appearing, toxic-appearing or diaphoretic.  HENT:     Head: Normocephalic and atraumatic.     Right Ear: Tympanic membrane, ear canal and external ear normal. Tympanic membrane is not erythematous or bulging.     Left Ear: Tympanic membrane, ear canal and external ear normal. Tympanic membrane is not erythematous or bulging.     Nose:     Right Sinus: No maxillary sinus tenderness or frontal sinus tenderness.     Left Sinus: No  maxillary sinus tenderness or frontal sinus tenderness.     Mouth/Throat:     Mouth: Mucous membranes are moist.     Pharynx: Oropharynx is clear. Uvula midline. No posterior oropharyngeal erythema.     Tonsils: No tonsillar exudate. 3+ on the right. 3+ on the left.  Eyes:     Conjunctiva/sclera: Conjunctivae normal.     Pupils:  Pupils are equal, round, and reactive to light.  Cardiovascular:     Rate and Rhythm: Normal rate and regular rhythm.  Pulmonary:     Effort: Pulmonary effort is normal. No accessory muscle usage, prolonged expiration, respiratory distress or retractions.     Breath sounds: No decreased air movement or transmitted upper airway sounds. No decreased breath sounds.     Comments: LCTAB Musculoskeletal:     Cervical back: Normal range of motion and neck supple.  Skin:    General: Skin is warm and dry.  Neurological:     Mental Status: She is alert and oriented to person, place, and time.      UC Treatments / Results  Labs (all labs ordered are listed, but only abnormal results are displayed) Labs Reviewed  NOVEL CORONAVIRUS, NAA    EKG   Radiology No results found.  Procedures Procedures (including critical care time)  Medications Ordered in UC Medications - No data to display  Initial Impression / Assessment and Plan / UC Course  I have reviewed the triage vital signs and the nursing notes.  Pertinent labs & imaging results that were available during my care of the patient were reviewed by me and considered in my medical decision making (see chart for details).    COVID PCR test ordered. Patient to quarantine until testing results return. No alarming signs on exam. LCTAB. Symptomatic treatment discussed.  Push fluids.  Return precautions given.  Patient expresses understanding and agrees to plan.  Final Clinical Impressions(s) / UC Diagnoses   Final diagnoses:  Encounter for screening for COVID-19  Cough  Throat irritation  Nasal  congestion   ED Prescriptions    Medication Sig Dispense Auth. Provider   azelastine (ASTELIN) 0.1 % nasal spray Place 2 sprays into both nostrils 2 (two) times daily. 30 mL Sharie Amorin V, PA-C   benzonatate (TESSALON) 200 MG capsule Take 1 capsule (200 mg total) by mouth every 8 (eight) hours. 21 capsule Lativia Velie V, PA-C   lidocaine (XYLOCAINE) 2 % solution 5-15 mL gurgle as needed 150 mL Ok Edwards, PA-C     PDMP not reviewed this encounter.   Ok Edwards, PA-C 01/23/20 [phone]

## 2020-01-23 NOTE — Discharge Instructions (Signed)
COVID PCR testing ordered. I would like you to quarantine until testing results. Tessalon for cough. Start lidocaine for sore throat, do not eat or drink for the next 40 mins after use as it can stunt your gag reflex. Start flonase, azelastine nasal spray for nasal congestion/drainage. You can use over the counter nasal saline rinse such as neti pot for nasal congestion. Keep hydrated, your urine should be clear to pale yellow in color. Tylenol/motrin for fever and pain. Monitor for any worsening of symptoms, chest pain, shortness of breath, wheezing, swelling of the throat, go to the emergency department for further evaluation needed.

## 2020-01-23 NOTE — ED Triage Notes (Signed)
Pt sts URI sx with cough x 6 days

## 2020-01-24 LAB — NOVEL CORONAVIRUS, NAA: SARS-CoV-2, NAA: NOT DETECTED

## 2020-01-24 LAB — SARS-COV-2, NAA 2 DAY TAT

## 2020-06-28 IMAGING — US US PELVIS COMPLETE WITH TRANSVAGINAL
1 series · 14 of 25 positions shown · non-contrast
Comparison: None

CLINICAL DATA: Acute pelvic pain for 1 day.

EXAM:
TRANSABDOMINAL AND TRANSVAGINAL ULTRASOUND OF PELVIS
TECHNIQUE: Both transabdominal and transvaginal ultrasound examinations of the
pelvis were performed. Transabdominal technique was performed for
global imaging of the pelvis including uterus, ovaries, adnexal
regions, and pelvic cul-de-sac. It was necessary to proceed with
endovaginal exam following the transabdominal exam to visualize the
endometrium and ovaries.

[Series 1: us pelvis complete with transvaginal · 14 of 88 slices shown]
[im 1/88]
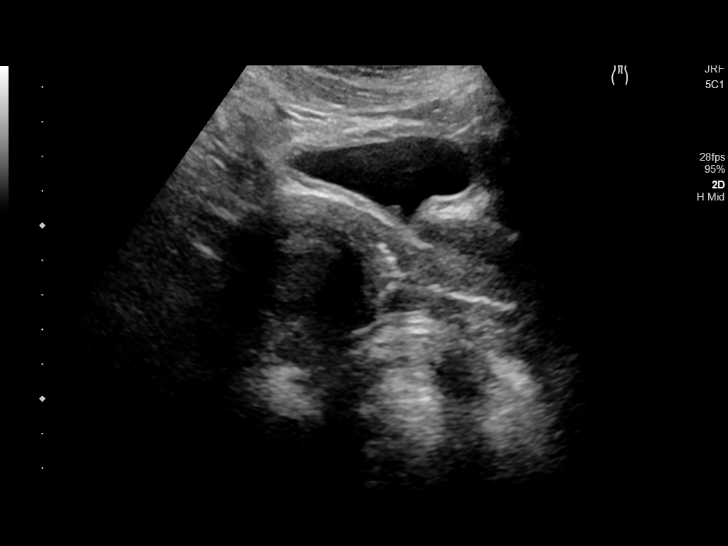
[im 8/88]
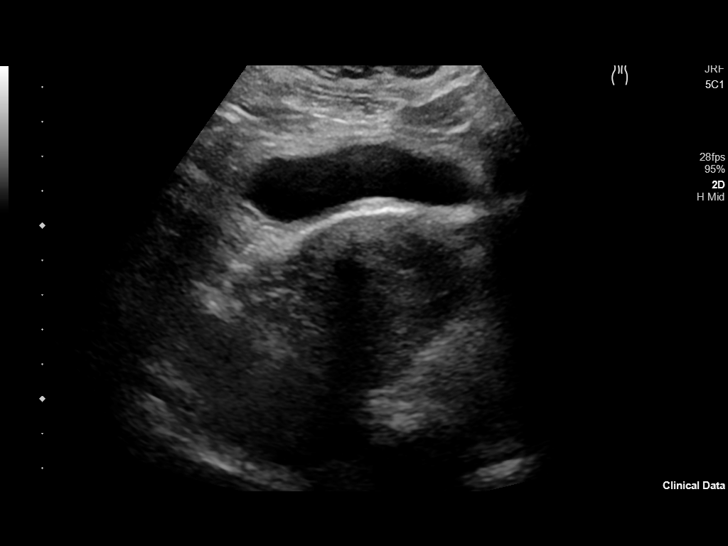
[im 15/88]
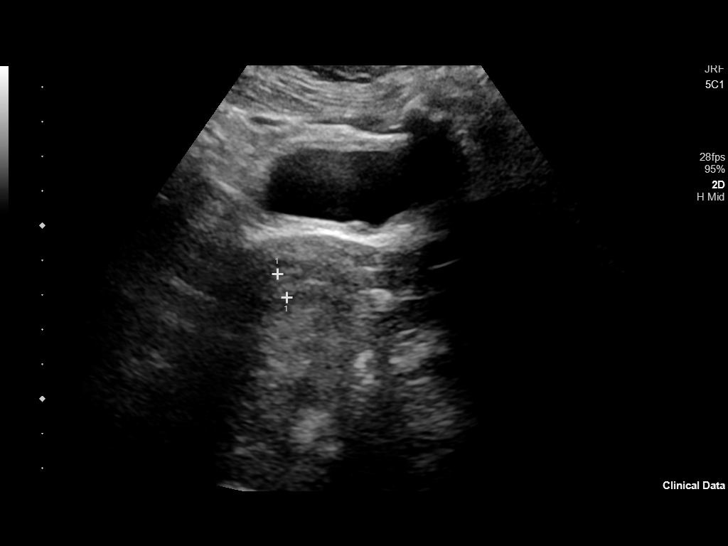
[im 22/88]
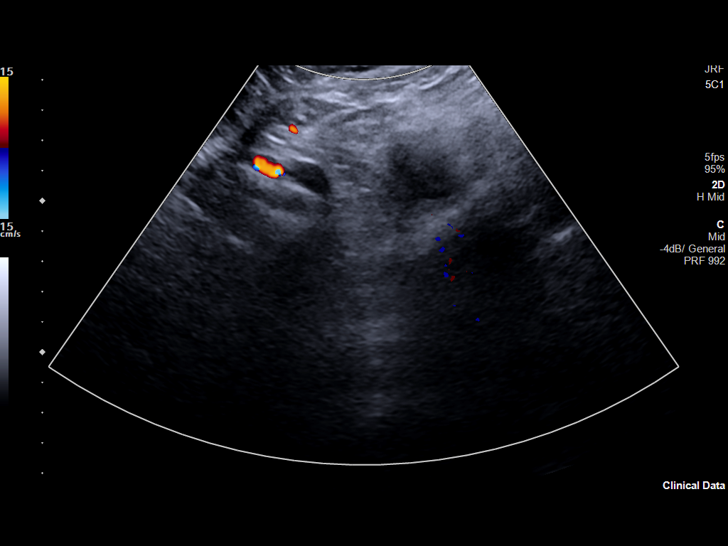
[im 30/88]
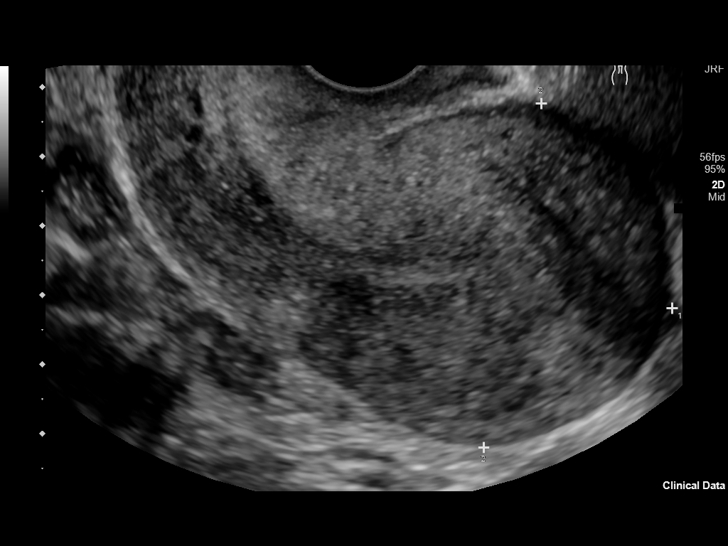
[im 33/88]
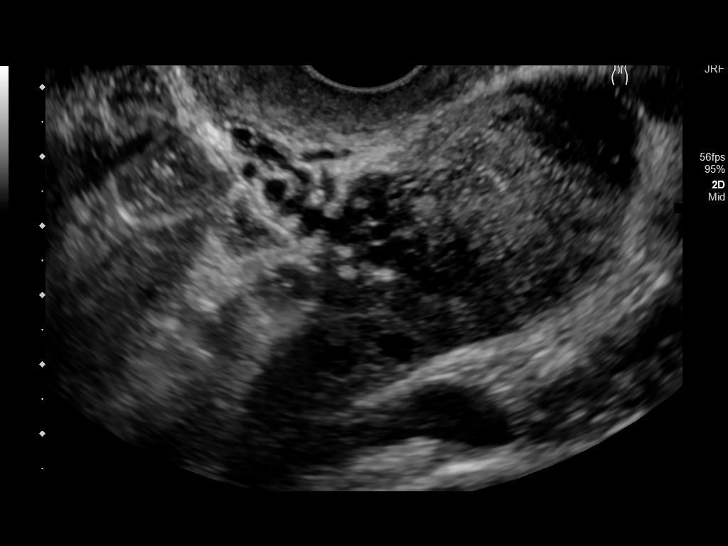
[im 40/88]
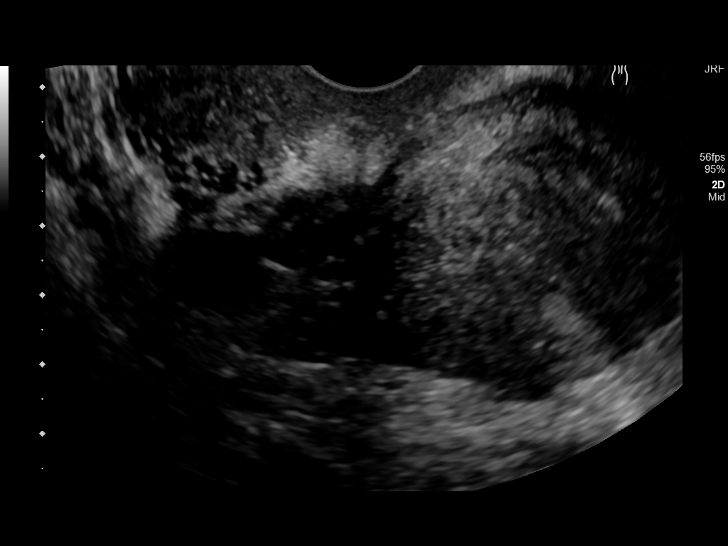
[im 48/88]
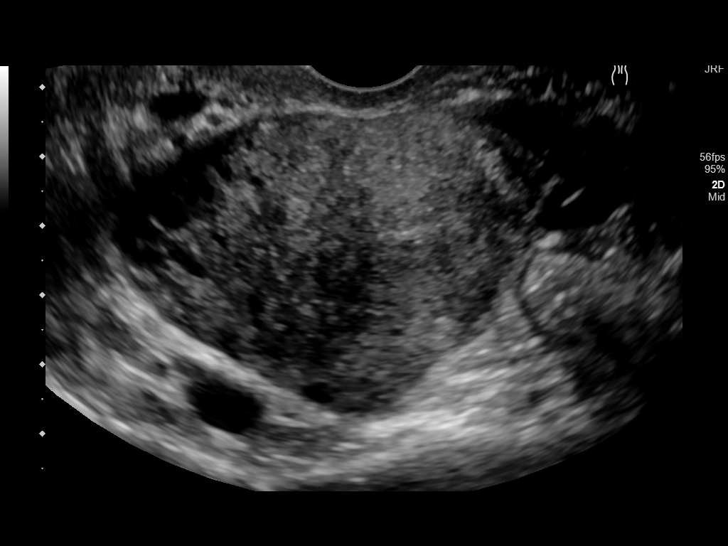
[im 55/88]
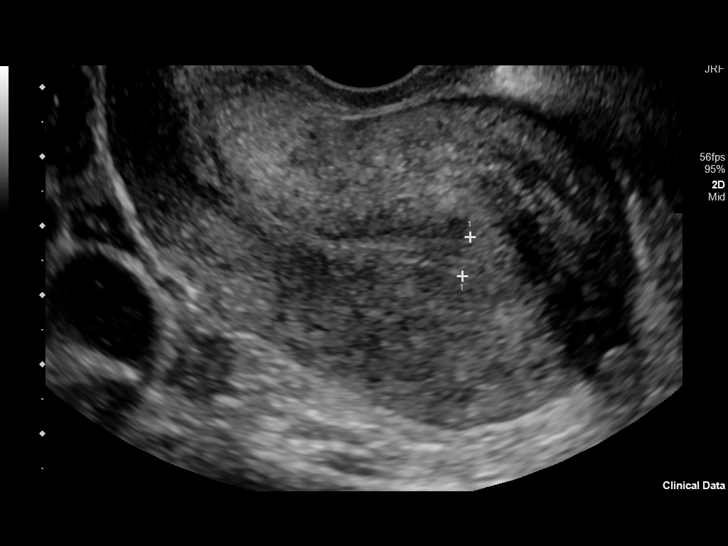
[im 59/88]
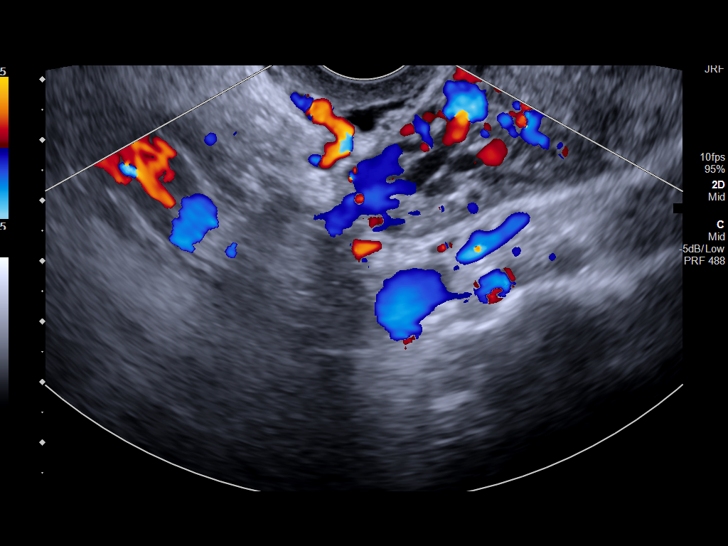
[im 66/88]
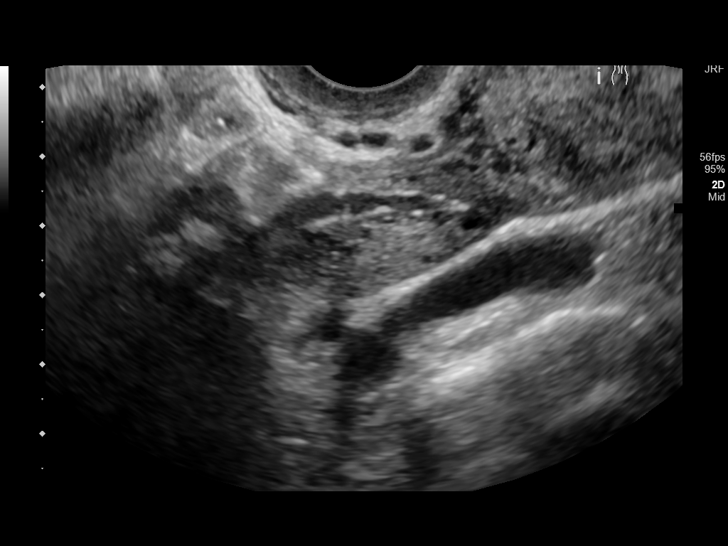
[im 73/88]
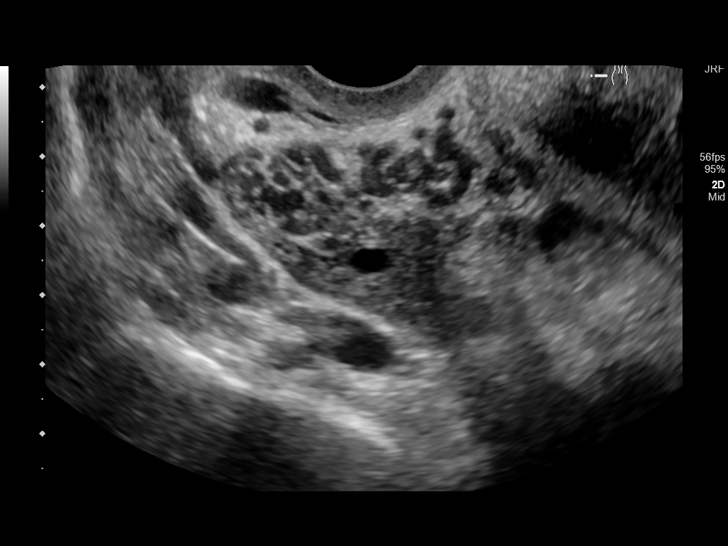
[im 80/88]
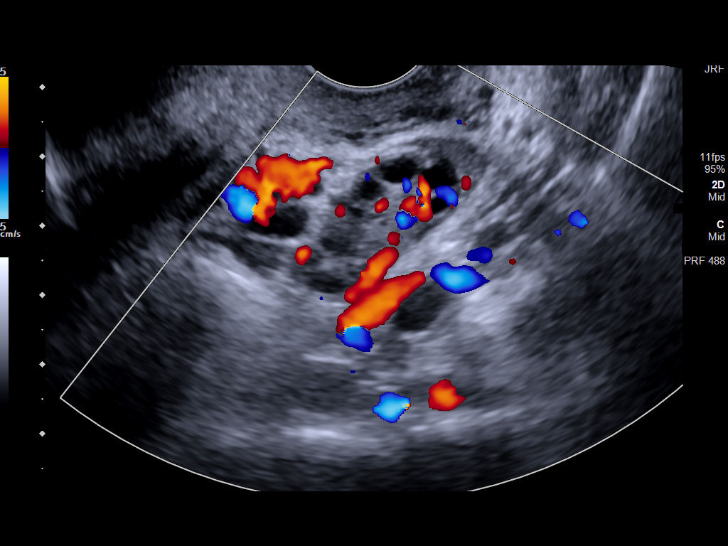
[im 88/88]
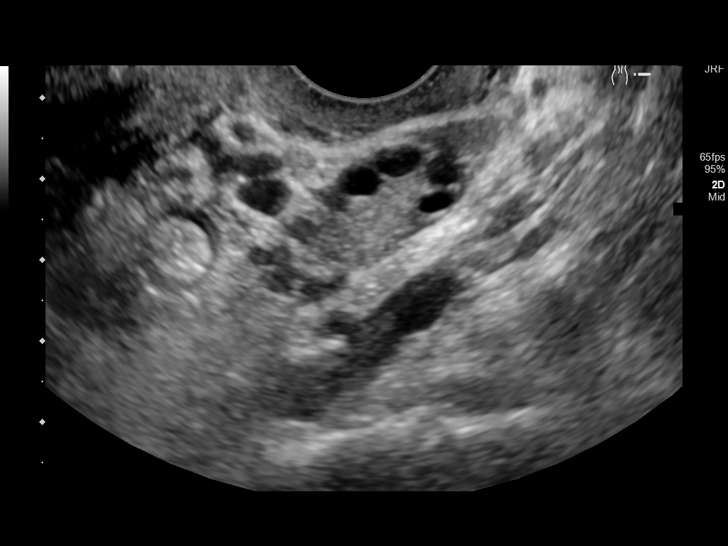

[14 of 25 positions shown; findings below may reference images not displayed]

FINDINGS: Uterus

Measurements: 8.8 x 5.0 x 6.3 cm = volume: 146 mL. Retroverted. No
fibroids or other mass visualized.

Endometrium

Thickness: 6 mm.  No focal abnormality visualized.

Right ovary

Measurements: 2.9 x 1.3 x 2.0 cm = volume: 3.9 mL. Normal
appearance/no adnexal mass.

Left ovary

Measurements: 3.2 x 1.3 x 1.7 cm = volume: 3.9 mL. Normal
appearance/no adnexal mass.

Other findings

No abnormal free fluid.
IMPRESSION: Normal appearance of uterus and both ovaries. No pelvic mass or
other significant abnormality identified.

## 2020-08-28 ENCOUNTER — Other Ambulatory Visit: Payer: Self-pay

## 2020-08-28 ENCOUNTER — Encounter: Payer: Self-pay | Admitting: Emergency Medicine

## 2020-08-28 ENCOUNTER — Ambulatory Visit
Admission: EM | Admit: 2020-08-28 | Discharge: 2020-08-28 | Disposition: A | Discharge status: Home / Self Care | Payer: Managed Care, Other (non HMO) | Attending: Emergency Medicine | Admitting: Emergency Medicine

## 2020-08-28 DIAGNOSIS — N390 Urinary tract infection, site not specified: Secondary | ICD-10-CM

## 2020-08-28 DIAGNOSIS — B373 Candidiasis of vulva and vagina: Secondary | ICD-10-CM | POA: Diagnosis present

## 2020-08-28 DIAGNOSIS — B3731 Acute candidiasis of vulva and vagina: Secondary | ICD-10-CM

## 2020-08-28 LAB — URINALYSIS, COMPLETE (UACMP) WITH MICROSCOPIC
Bilirubin Urine: NEGATIVE
Glucose, UA: NEGATIVE mg/dL
Hgb urine dipstick: NEGATIVE
Ketones, ur: NEGATIVE mg/dL
Leukocytes,Ua: NEGATIVE
Nitrite: NEGATIVE
Protein, ur: NEGATIVE mg/dL
RBC / HPF: NONE SEEN RBC/hpf (ref 0–5)
Specific Gravity, Urine: 1.025 (ref 1.005–1.030)
pH: 7.5 (ref 5.0–8.0)

## 2020-08-28 LAB — WET PREP, GENITAL
Clue Cells Wet Prep HPF POC: NONE SEEN
Sperm: NONE SEEN
Trich, Wet Prep: NONE SEEN

## 2020-08-28 LAB — PREGNANCY, URINE: Preg Test, Ur: NEGATIVE

## 2020-08-28 MED ORDER — FLUCONAZOLE 150 MG PO TABS: 150.0000 mg | ORAL_TABLET | ORAL | 0 refills | Status: DC | PRN

## 2020-08-28 MED ORDER — NITROFURANTOIN MONOHYD MACRO 100 MG PO CAPS: 100.0000 mg | ORAL_CAPSULE | Freq: Two times a day (BID) | ORAL | 0 refills | Status: DC

## 2020-08-28 NOTE — Discharge Instructions (Signed)
You were seen for right lower abdominal pain and are being treated for urinary tract infection and yeast infection.   We are sending your urine out for a culture. If we need to add or change any medications, our nurse will give you a call to let you know.  Take the antibiotics as prescribed until they're finished. If you think you're having a reaction, stop the medication, take benadryl and go to the nearest urgent care/emergency room. Take a probiotic while taking the antibiotic to decrease the chances of stomach upset.   Increase your water intake.  Start using protection while having sex.  Take care, Dr. Marland Kitchen, NP-c

## 2020-08-28 NOTE — ED Provider Notes (Signed)
[facility] - [place], [place]   Name: Sherry Villarreal DOB: [birth year] MRN: [id number] CSN: [id number] PCP: Jerel Shepherd, MD  Arrival date and time:  08/28/20 1538  Chief Complaint:  Abdominal Pain (RLQ)   NOTE: Prior to seeing the patient today, I have reviewed the triage nursing documentation and vital signs. Clinical staff has updated patient's PMH/PSHx, current medication list, and drug allergies/intolerances to ensure comprehensive history available to assist in medical decision making.   History:   HPI: Sherry Villarreal is a 35 y.o. female who presents today with complaints of pain to right lower quadrant of her abdomen the past 48 hours.  Patient states pain started midday and worsened during sex last night.  Patient says she has had similar pain before, her diagnoses varied from a ruptured ovarian cyst, yeast infection, bacterial vaginosis.  Since her previous cyst rupture, she is unaware if she has any more cyst.   Denies fever, body aches, changes in appetite.she has slight nausea but no vomiting.  Patient is sexually active with 1 female partner.  She is not on birth control and do not use any barrier protection.  Appendix is intact.  No recent antibiotic use.   Past Medical History:  Diagnosis Date  . Asthma   . Psoriasis   . Rheumatoid arthritis [facility])     Past Surgical History:  Procedure Laterality Date  . DILATION AND CURETTAGE OF UTERUS    . NO PAST SURGERIES      Family History  Problem Relation Age of Onset  . Asthma Mother   . Healthy Father     Social History   Tobacco Use  . Smoking status: Never Smoker  . Smokeless tobacco: Never Used  Vaping Use  . Vaping Use: Never used  Substance Use Topics  . Alcohol use: Not Currently  . Drug use: Not Currently    Patient Active Problem List   Diagnosis Date Noted  . Neck pain 11/05/2019  . Chronic pelvic pain in female 10/16/2019  . Suprapubic pain 12/09/2017  . Amenorrhea 11/08/2017  .  Encounter for contraceptive management 11/08/2017  . Panic attacks 04/02/2017  . High risk medication use 12/26/2016  . Mild persistent asthma without complication 99/24/2683  . 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 Rheumatoid arthritis involving multiple sites with positive rheumatoid factor ([place]) 09/12/2016    Home Medications:    Current Meds  Medication Sig  . Fluticasone Propionate, Inhal, (FLOVENT DISKUS) 250 MCG/BLIST AEPB Inhale into the lungs.  . folic acid (FOLVITE) 1 MG tablet Take 1 mg by mouth daily.  . methotrexate (RHEUMATREX) 2.5 MG tablet methotrexate sodium 2.5 mg tablet  . montelukast (SINGULAIR) 10 MG tablet Take 1 tablet (10 mg total) by mouth at bedtime.    Allergies:   Penicillins  Review of Systems (ROS): Review of Systems  Constitutional: Negative for fatigue and fever.  Gastrointestinal: Positive for abdominal pain and nausea. Negative for vomiting.  Genitourinary: Positive for dysuria, pelvic pain and vaginal discharge. Negative for flank pain, urgency, vaginal bleeding and vaginal pain.     Vital Signs: Today's Vitals   08/28/20 1549 08/28/20 1550 08/28/20 1554  BP:   114/70  Pulse:   60  Resp:   14  Temp:   98.4 F (36.9 C)  TempSrc:   Oral  SpO2:   100%  Weight:  155 lb (70.3 kg)   Height:  5\' 4"  (1.626 m)   PainSc: 8  Physical Exam: Physical Exam Vitals and nursing note reviewed.  Constitutional:      Appearance: Normal appearance.  Cardiovascular:     Rate and Rhythm: Normal rate and regular rhythm.     Pulses: Normal pulses.     Heart sounds: Normal heart sounds.  Pulmonary:     Effort: Pulmonary effort is normal.     Breath sounds: Normal breath sounds.  Abdominal:     General: Abdomen is flat. Bowel sounds are normal. There is no distension.     Palpations: Abdomen is soft.     Tenderness: There is abdominal tenderness in the right lower quadrant and suprapubic area. There is no right CVA tenderness, left CVA  tenderness or guarding.  Skin:    General: Skin is warm and dry.  Neurological:     General: No focal deficit present.     Mental Status: She is alert and oriented to person, place, and time.  Psychiatric:        Mood and Affect: Mood normal.        Behavior: Behavior normal.      Urgent Care Treatments / Results:   LABS: PLEASE NOTE: all labs that were ordered this encounter are listed, however only abnormal results are displayed. Labs Reviewed  WET PREP, GENITAL - Abnormal; Notable for the following components:      Result Value   Yeast Wet Prep HPF POC PRESENT (*)    WBC, Wet Prep HPF POC FEW (*)    All other components within normal limits  URINALYSIS, COMPLETE (UACMP) WITH MICROSCOPIC - Abnormal; Notable for the following components:   APPearance HAZY (*)    Bacteria, UA FEW (*)    All other components within normal limits  CHLAMYDIA/NGC RT PCR ([facility] ONLY)  PREGNANCY, URINE    EKG: -None  RADIOLOGY: No results found.  PROCEDURES: Procedures  MEDICATIONS RECEIVED THIS VISIT: Medications - No data to display  PERTINENT CLINICAL COURSE NOTES/UPDATES:   Initial Impression / Assessment and Plan / Urgent Care Course:  Pertinent labs & imaging results that were available during my care of the patient were personally reviewed by me and considered in my medical decision making (see lab/imaging section of note for values and interpretations).  Sherry Villarreal is a 35 y.o. female who presents to [facility] today with complaints of right lower quadrant abdominal pain, diagnosed with urinary tract infection and yeast infection, and treated as such with the medications below. NP and patient reviewed discharge instructions below during visit.   Patient is well appearing overall in clinic today. She does not appear to be in any acute distress. Presenting symptoms (see HPI) and exam as documented above.   I have reviewed the follow up and strict return  precautions for any new or worsening symptoms. Patient is aware of symptoms that would be deemed urgent/emergent, and would thus require further evaluation either here or in the emergency department. At the time of discharge, she verbalized understanding and consent with the discharge plan as it was reviewed with her. All questions were fielded by provider and/or clinic staff prior to patient discharge.    Final Clinical Impressions / Urgent Care Diagnoses:   Final diagnoses:  Lower urinary tract infectious disease  Vulvovaginitis due to yeast    New Prescriptions:  [place] Controlled Substance Registry consulted? Not Applicable  Meds ordered this encounter  Medications  . fluconazole (DIFLUCAN) 150 MG tablet    Sig: Take 1 tablet (150 mg total) by mouth  every 3 (three) days as needed.    Dispense:  2 tablet    Refill:  0  . nitrofurantoin, macrocrystal-monohydrate, (MACROBID) 100 MG capsule    Sig: Take 1 capsule (100 mg total) by mouth 2 (two) times daily.    Dispense:  10 capsule    Refill:  0      Discharge Instructions     You were seen for right lower abdominal pain and are being treated for urinary tract infection and yeast infection.   We are sending your urine out for a culture. If we need to add or change any medications, our nurse will give you a call to let you know.  Take the antibiotics as prescribed until they're finished. If you think you're having a reaction, stop the medication, take benadryl and go to the nearest urgent care/emergency room. Take a probiotic while taking the antibiotic to decrease the chances of stomach upset.   Increase your water intake.  Start using protection while having sex.  Take care, Dr. Marland Kitchen, NP-c      Recommended Follow up Care:  Patient encouraged to follow up with the following provider within the specified time frame, or sooner as dictated by the severity of her symptoms. As always, she was instructed that for any  urgent/emergent care needs, she should seek care either here or in the emergency department for more immediate evaluation.   Gertie Baron, DNP, NP-c    Gertie Baron, NP 08/28/20 1640

## 2020-08-28 NOTE — ED Triage Notes (Signed)
Patient c/o RLQ abdominal pain that started 2 days ago.  Patient states that after sex her pain worsen.  Patient reports history of ovarian cysts.  Patient reports some nausea.  Patient denies V/D.  Patient denies fevers or chills.  Patient reports pain when she urinates.  Patient reports some vaginal discharge.

## 2020-08-29 LAB — CHLAMYDIA/NGC RT PCR (ARMC ONLY)
Chlamydia Tr: NOT DETECTED
N gonorrhoeae: NOT DETECTED

## 2020-08-30 LAB — URINE CULTURE: Culture: NO GROWTH

## 2021-03-25 ENCOUNTER — Ambulatory Visit
Admission: EM | Admit: 2021-03-25 | Discharge: 2021-03-25 | Disposition: A | Discharge status: Home / Self Care | Payer: Managed Care, Other (non HMO) | Attending: Family Medicine | Admitting: Family Medicine

## 2021-03-25 ENCOUNTER — Other Ambulatory Visit: Payer: Self-pay

## 2021-03-25 ENCOUNTER — Encounter: Payer: Self-pay | Admitting: Emergency Medicine

## 2021-03-25 DIAGNOSIS — R102 Pelvic and perineal pain: Secondary | ICD-10-CM

## 2021-03-25 HISTORY — DX: Endometriosis, unspecified: N80.9

## 2021-03-25 MED ORDER — MELOXICAM 15 MG PO TABS: 15.0000 mg | ORAL_TABLET | Freq: Every day | ORAL | 0 refills | Status: DC | PRN

## 2021-03-25 MED ORDER — HYDROCODONE-ACETAMINOPHEN 5-325 MG PO TABS: 1.0000 | ORAL_TABLET | Freq: Three times a day (TID) | ORAL | 0 refills | Status: DC | PRN

## 2021-03-25 NOTE — Discharge Instructions (Signed)
Medications as prescribed to help your pain.  I am sorry I do not have any further explanation or treatment for your pain; this is a complex problem and is best addressed by your GYN.  Take care  Dr. Lacinda Axon

## 2021-03-25 NOTE — ED Provider Notes (Signed)
[facility]    CSN: [id number] Arrival date & time: 03/25/21  1445      History   Chief Complaint Chief Complaint  Patient presents with   Abdominal Pain    HPI  35 year old female presents with abdominal pain/pelvic pain.  Patient states that she has had ongoing abdominal cramping for the past several days.  She has recently had removal of endometrioma on 9/22.  Patient contacted her surgeon's office on 9/3.  They advised that the pain may be related to adenomyosis and advised ibuprofen and to follow-up with her clinic.  Patient continues to have lower abdominal pain which she describes as severe cramping.  Patient is very upset.  She has not had any vaginal bleeding.  She currently rates her pain is 8/10 in severity.  She states that she has taken naproxen, Motrin, and oxycodone without significant resolution in her pain.  She is very perplexed by why she continues to have pain in the setting of recent surgery and lack of menstrual cycle.  She has had no postoperative fever.  Currently afebrile.  No reports of nausea or vomiting.  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      Home Medications    Prior to Admission  medications   Medication Sig Start Date End Date Taking? Authorizing Provider  Fluticasone Propionate, Inhal, (FLOVENT DISKUS) 250 MCG/BLIST AEPB Inhale into the lungs. 06/29/20 06/29/21 Yes [provider]  folic acid (FOLVITE) 1 MG tablet Take 1 mg by mouth daily.   Yes [provider]  HYDROcodone-acetaminophen (NORCO/VICODIN) 5-325 MG tablet Take 1 tablet by mouth every 8 (eight) hours as needed for severe pain. 03/25/21  Yes Secilia Apps G, DO  meloxicam (MOBIC) 15 MG tablet Take 1 tablet (15 mg total) by mouth daily as needed for pain. 03/25/21  Yes Galileah Piggee G, DO  methotrexate (RHEUMATREX) 2.5 MG tablet methotrexate sodium 2.5 mg tablet 07/17/18  Yes [provider]  montelukast (SINGULAIR) 10 MG tablet Take 1 tablet (10 mg total) by mouth at bedtime. 06/23/19  Yes Melynda Ripple, MD  albuterol (VENTOLIN HFA) 108 (90 Base) MCG/ACT inhaler Inhale 1-2 puffs into the lungs every 6 (six) hours as needed for wheezing or shortness of breath. 06/23/19   Melynda Ripple, MD  azelastine (ASTELIN) 0.1 % nasal spray Place 2 sprays into both nostrils 2 (two) times daily. 01/23/20   Tasia Catchings, Amy V, PA-C  clobetasol ointment (TEMOVATE) 0.05 % clobetasol 0.05 % topical ointment    [provider]  Clocortolone Pivalate (CLODERM) 0.1 % cream Cloderm 0.1 % topical cream    [provider]  cyclobenzaprine (FLEXERIL) 10 MG tablet cyclobenzaprine 10  mg tablet  Take 1 tablet every day by oral route at bedtime for 30 days.    [provider]  Desoximetasone (TOPICORT) 0.25 % LIQD Topicort 0.25 % topical spray    [provider]  diazepam (VALIUM) 10 MG tablet Take 1 tablet (10 mg total) by mouth every 12 (twelve) hours as needed for anxiety (Take prior to next appt). 11/06/19   Edrick Kins, DPM  famotidine (PEPCID) 40 MG tablet famotidine 40 mg tablet    [provider]  fluticasone (FLONASE) 50 MCG/ACT nasal spray Place 2 sprays into both nostrils  daily. 06/23/19   Melynda Ripple, MD  gentamicin cream (GARAMYCIN) 0.1 % Apply 1 application topically 2 (two) times daily. 11/06/19   Edrick Kins, DPM  lidocaine (XYLOCAINE) 2 % solution 5-15 mL gurgle as needed 01/23/20   Tasia Catchings, Amy V, PA-C  medroxyPROGESTERone (DEPO-PROVERA) 150 MG/ML injection medroxyprogesterone 150 mg/mL intramuscular suspension  INJECT 1 MILLILITER INTRAMUSCULARLY EVERY 3 MONTHS    [provider]  norethindrone (AYGESTIN) 5 MG tablet Take 5 mg by mouth daily. 03/06/21   [provider]  Spacer/Aero-Holding Chambers (AEROCHAMBER PLUS) inhaler Use as instructed 06/23/19   Melynda Ripple, MD  sulfaSALAzine (AZULFIDINE) 500 MG tablet sulfasalazine 500 mg tablet  Take 1 tablet twice a day by oral route for 30 days.  01/23/20  [provider]    Family History Family History  Problem Relation Age of Onset   Asthma Mother    Healthy Father     Social History Social History   Tobacco Use   Smoking status: Never   Smokeless tobacco: Never  Vaping Use   Vaping Use: Never used  Substance Use Topics   Alcohol use: Not Currently   Drug use: Not Currently     Allergies   Penicillins   Review of Systems Review of Systems  Constitutional:  Negative for fever.  Gastrointestinal:  Positive for abdominal pain.    Physical Exam Triage Vital Signs ED Triage Vitals  Enc Vitals Group     BP 03/25/21 1502 111/72     Pulse Rate 03/25/21 1502 72     Resp 03/25/21 1502 14     Temp 03/25/21 1502 98.4 F (36.9 C)     Temp Source 03/25/21 1502 Oral     SpO2 03/25/21 1502 100 %     Weight 03/25/21 1457 160 lb (72.6 kg)     Height 03/25/21 1457 5\' 4"  (1.626 m)     Head Circumference --      Peak Flow --      Pain Score 03/25/21 1456 8     Pain Loc --      Pain Edu? --      Excl. in [place]? --    No data found.  Updated Vital Signs BP 111/72 (BP Location: Right Arm)   Pulse 72   Temp 98.4 F (36.9 C) (Oral)   Resp 14   Ht 5\' 4"  (1.626  m)   Wt 72.6 kg   SpO2 100%   BMI 27.46 kg/m   Visual Acuity Right Eye Distance:   Left Eye Distance:   Bilateral Distance:    Right Eye Near:   Left Eye Near:    Bilateral Near:     Physical Exam Constitutional:      General: She is not in acute distress.    Appearance: She is not ill-appearing.  HENT:     Head: Normocephalic and atraumatic.  Cardiovascular:  Rate and Rhythm: Normal rate and regular rhythm.  Pulmonary:     Effort: Pulmonary effort is normal.     Breath sounds: Normal breath sounds. No wheezing, rhonchi or rales.  Abdominal:     Palpations: Abdomen is soft.     Comments: Mild tenderness over the lower abdomen and the incision sites.  Neurological:     Mental Status: She is alert.     UC Treatments / Results  Labs (all labs ordered are listed, but only abnormal results are displayed) Labs Reviewed - No data to display  EKG   Radiology No results found.  Procedures Procedures (including critical care time)  Medications Ordered in UC Medications - No data to display  Initial Impression / Assessment and Plan / UC Course  I have reviewed the triage vital signs and the nursing notes.  Pertinent labs & imaging results that were available during my care of the patient were reviewed by me and considered in my medical decision making (see chart for details).    35 year old female presents with abdominal cramping/pelvic pain.  I feel that her pain is coming from underlying adenomyosis as mentioned by her GYN.  I advised the patient that she needs to see her OB/GYN regarding this.  I do not have any other explanation for her pelvic pain at this time.  She is currently in no distress.  I advised that my recommendation would be to treat her pain and have her follow-up with her OB/GYN.  I have sent in Rx's for meloxicam and hydrocodone.  Final Clinical Impressions(s) / UC Diagnoses   Final diagnoses:  Pelvic pain in female     Discharge  Instructions      Medications as prescribed to help your pain.  I am sorry I do not have any further explanation or treatment for your pain; this is a complex problem and is best addressed by your GYN.  Take care  Dr. Lacinda Axon    ED Prescriptions     Medication Sig Dispense Auth. Provider   meloxicam (MOBIC) 15 MG tablet Take 1 tablet (15 mg total) by mouth daily as needed for pain. 30 tablet Quindon Denker G, DO   HYDROcodone-acetaminophen (NORCO/VICODIN) 5-325 MG tablet Take 1 tablet by mouth every 8 (eight) hours as needed for severe pain. 10 tablet Thersa Salt G, DO      I have reviewed the PDMP during this encounter.   Coral Spikes, [place] 03/25/21 1626

## 2021-03-25 NOTE — ED Triage Notes (Signed)
Patient c/o lower abdominal cramping that started 3 days ago.  Patient states that she had Lap surgery on her abdomen for Endometriosis on 03/09/21.  Patient states that she takes medication to stop her periods.  Patient denies any vaginal bleeding.

## 2021-07-24 ENCOUNTER — Emergency Department
Admission: EM | Admit: 2021-07-24 | Discharge: 2021-07-24 | Disposition: A | Discharge status: Home / Self Care | Payer: Managed Care, Other (non HMO) | Attending: Emergency Medicine | Admitting: Emergency Medicine

## 2021-07-24 ENCOUNTER — Other Ambulatory Visit: Payer: Self-pay

## 2021-07-24 DIAGNOSIS — T50901A Poisoning by unspecified drugs, medicaments and biological substances, accidental (unintentional), initial encounter: Secondary | ICD-10-CM

## 2021-07-24 DIAGNOSIS — J45909 Unspecified asthma, uncomplicated: Secondary | ICD-10-CM | POA: Insufficient documentation

## 2021-07-24 DIAGNOSIS — T452X1A Poisoning by vitamins, accidental (unintentional), initial encounter: Secondary | ICD-10-CM | POA: Insufficient documentation

## 2021-07-24 DIAGNOSIS — R110 Nausea: Secondary | ICD-10-CM | POA: Diagnosis present

## 2021-07-24 LAB — CBC
HCT: 38.6 % (ref 36.0–46.0)
Hemoglobin: 12.6 g/dL (ref 12.0–15.0)
MCH: 30.6 pg (ref 26.0–34.0)
MCHC: 32.6 g/dL (ref 30.0–36.0)
MCV: 93.7 fL (ref 80.0–100.0)
Platelets: 312 10*3/uL (ref 150–400)
RBC: 4.12 MIL/uL (ref 3.87–5.11)
RDW: 13.9 % (ref 11.5–15.5)
WBC: 9.1 10*3/uL (ref 4.0–10.5)
nRBC: 0 % (ref 0.0–0.2)

## 2021-07-24 LAB — URINALYSIS, COMPLETE (UACMP) WITH MICROSCOPIC
Bilirubin Urine: NEGATIVE
Glucose, UA: NEGATIVE mg/dL
Hgb urine dipstick: NEGATIVE
Leukocytes,Ua: NEGATIVE
Nitrite: NEGATIVE
Protein, ur: NEGATIVE mg/dL
Specific Gravity, Urine: 1.02 (ref 1.005–1.030)
pH: 7 (ref 5.0–8.0)

## 2021-07-24 LAB — BASIC METABOLIC PANEL
Anion gap: 7 (ref 5–15)
BUN: 14 mg/dL (ref 6–20)
CO2: 26 mmol/L (ref 22–32)
Calcium: 9.1 mg/dL (ref 8.9–10.3)
Chloride: 102 mmol/L (ref 98–111)
Creatinine, Ser: 0.88 mg/dL (ref 0.44–1.00)
GFR, Estimated: >60 mL/min (ref >60)
Glucose, Bld: 104 mg/dL — ABNORMAL HIGH (ref 70–99)
Potassium: 3.8 mmol/L (ref 3.5–5.1)
Sodium: 135 mmol/L (ref 135–145)

## 2021-07-24 LAB — PHOSPHORUS: Phosphorus: 3 mg/dL (ref 2.5–4.6)

## 2021-07-24 LAB — PREGNANCY, URINE: Preg Test, Ur: NEGATIVE

## 2021-07-24 MED ORDER — ONDANSETRON HCL 4 MG/2ML IJ SOLN
4.0000 mg | Freq: Once | INTRAMUSCULAR | Status: AC
  Administered 2021-07-24: 4 mg via INTRAVENOUS
  Filled 2021-07-24: qty 2

## 2021-07-24 MED ORDER — SODIUM CHLORIDE 0.9 % IV BOLUS
1000.0000 mL | Freq: Once | INTRAVENOUS | Status: AC
  Administered 2021-07-24: 1000 mL via INTRAVENOUS

## 2021-07-24 NOTE — ED Notes (Signed)
No new orders at this time per MD [place].

## 2021-07-24 NOTE — Discharge Instructions (Signed)
You should stop taking the vitamin D supplement until you are able to have your blood work rechecked through either your primary care doctor or your fertility specialist.  You should drink plenty of fluids in the meantime and limit any calcium intake.  Please return to the ER for reevaluation if you have any worsening symptoms.

## 2021-07-24 NOTE — ED Provider Notes (Addendum)
[facility] Provider Note    Event Date/Time   First MD Initiated Contact with Patient 07/24/21 1720     (approximate)   History   Chief Complaint nausea   HPI  Sherry Villarreal is a 36 y.o. female with past medical history of rheumatoid arthritis, asthma, and endometriosis who presents to the ED complaining of nausea.  Patient reports that she was initially prescribed 50,000 units of vitamin D to take once weekly by her fertility specialist 8 days ago.  She misunderstood and has been taking the 50,000 units once daily since then.  She states that a couple of days after starting the vitamin D, she has begun to feel nauseous, lightheaded, "woozy", generally weak, with a headache.  She denies any fevers, cough, chest pain, shortness of breath, abdominal pain, dysuria, or vaginal bleeding.  She denies any vision changes, speech changes, numbness, or weakness.  She denies any intent to harm herself by taking the extra medication.     Physical Exam   Triage Vital Signs: ED Triage Vitals  Enc Vitals Group     BP 07/24/21 1647 (!) 106/92     Pulse Rate 07/24/21 1647 65     Resp 07/24/21 1647 18     Temp 07/24/21 1647 98 F (36.7 C)     Temp src --      SpO2 07/24/21 1647 95 %     Weight --      Height --      Head Circumference --      Peak Flow --      Pain Score 07/24/21 1628 0     Pain Loc --      Pain Edu? --      Excl. in [place]? --     Most recent vital signs: Vitals:   07/24/21 1647  BP: (!) 106/92  Pulse: 65  Resp: 18  Temp: 98 F (36.7 C)  SpO2: 95%    Constitutional: Alert and oriented. Eyes: Conjunctivae are normal. Head: Atraumatic. Nose: No congestion/rhinnorhea. Mouth/Throat: Mucous membranes are moist.  Cardiovascular: Normal rate, regular rhythm. Grossly normal heart sounds.  2+ radial pulses bilaterally. Respiratory: Normal respiratory effort.  No retractions. Lungs CTAB. Gastrointestinal: Soft and nontender. No  distention. Musculoskeletal: No lower extremity tenderness nor edema.  Neurologic:  Normal speech and language. No gross focal neurologic deficits are appreciated.    ED Results / Procedures / Treatments   Labs (all labs ordered are listed, but only abnormal results are displayed) Labs Reviewed  BASIC METABOLIC PANEL - Abnormal; Notable for the following components:      Result Value   Glucose, Bld 104 (*)    All other components within normal limits  URINALYSIS, COMPLETE (UACMP) WITH MICROSCOPIC - Abnormal; Notable for the following components:   Ketones, ur TRACE (*)    Bacteria, UA RARE (*)    All other components within normal limits  CBC  PREGNANCY, URINE  PHOSPHORUS     PROCEDURES:  Critical Care performed: No  Procedures  ED ECG REPORT I, Blake Divine, the attending physician, personally viewed and interpreted this ECG.   Date: 07/24/2021  EKG Time: 19:30  Rate: 67  Rhythm: normal sinus rhythm  Axis: Normal  Intervals:none  ST&T Change: None    MEDICATIONS ORDERED IN ED: Medications  sodium chloride 0.9 % bolus 1,000 mL (1,000 mLs Intravenous New Bag/Given 07/24/21 1813)  ondansetron (ZOFRAN) injection 4 mg (4 mg Intravenous Given 07/24/21 1816)  IMPRESSION / MDM / ASSESSMENT AND PLAN / ED COURSE  I reviewed the triage vital signs and the nursing notes.                              36 y.o. female with possible history of rheumatoid arthritis, asthma, and endometriosis who presents to the ED with gradually worsening nausea, lightheadedness, generalized weakness, and malaise after accidentally taking additional doses of high-dose vitamin D.  Differential diagnosis includes, but is not limited to, vitamin D toxicity, hypercalcemia, hyperphosphatemia, other electrolyte abnormality, AKI, dehydration, arrhythmia, pregnancy.  Patient is nontoxic-appearing and in no acute distress, vital signs are reassuring.  CBC shows no anemia and BMP shows no  electrolyte abnormality.  Case discussed with poison control, who recommends IV fluid hydration and checking phosphate level.  If she is feeling better and phosphate level is unremarkable, patient would be appropriate for outpatient follow-up with recheck of labs in 5 to 7 days per poison control.  Phosphorus noted to be within normal limits at 3.  Patient feeling slightly better following IV fluid bolus.  Case again discussed with poison control, who agrees with plan for outpatient follow-up in 5 to 7 days for repeat labs.  Patient was advised to hold any vitamin D supplementation until repeat labs are checked and she is reassessed.  She was advised to drink plenty of fluids in the meantime and keep a low calcium diet.  She was counseled to return to the ED for new or worsening symptoms, patient agrees with plan.      FINAL CLINICAL IMPRESSION(S) / ED DIAGNOSES   Final diagnoses:  Accidental overdose, initial encounter  Poisoning by vitamin D, accidental or unintentional, initial encounter     Rx / DC Orders   ED Discharge Orders     None        Note:  This document was prepared using Dragon voice recognition software and may include unintentional dictation errors.   Blake Divine, MD 07/24/21 1911    Blake Divine, MD 07/24/21 [phone]

## 2021-07-24 NOTE — ED Triage Notes (Signed)
Pt comes with c/o nausea and possible vitamin D overdose. Pt states she was prescribed Vitamin D to take 1x a week, pt states she read it wrong and has been taking it everyday for about a week now.

## 2021-07-26 ENCOUNTER — Emergency Department
Admission: EM | Admit: 2021-07-26 | Discharge: 2021-07-26 | Disposition: A | Discharge status: Home / Self Care | Payer: Managed Care, Other (non HMO) | Attending: Student in an Organized Health Care Education/Training Program | Admitting: Student in an Organized Health Care Education/Training Program

## 2021-07-26 ENCOUNTER — Encounter: Payer: Self-pay | Admitting: Emergency Medicine

## 2021-07-26 ENCOUNTER — Other Ambulatory Visit: Payer: Self-pay

## 2021-07-26 DIAGNOSIS — R519 Headache, unspecified: Secondary | ICD-10-CM | POA: Insufficient documentation

## 2021-07-26 DIAGNOSIS — J45909 Unspecified asthma, uncomplicated: Secondary | ICD-10-CM | POA: Diagnosis not present

## 2021-07-26 DIAGNOSIS — R110 Nausea: Secondary | ICD-10-CM | POA: Diagnosis not present

## 2021-07-26 DIAGNOSIS — Z20822 Contact with and (suspected) exposure to covid-19: Secondary | ICD-10-CM | POA: Diagnosis not present

## 2021-07-26 LAB — COMPREHENSIVE METABOLIC PANEL
ALT: 16 U/L (ref 0–44)
AST: 20 U/L (ref 15–41)
Albumin: 4.4 g/dL (ref 3.5–5.0)
Alkaline Phosphatase: 57 U/L (ref 38–126)
Anion gap: 9 (ref 5–15)
BUN: 14 mg/dL (ref 6–20)
CO2: 25 mmol/L (ref 22–32)
Calcium: 9.5 mg/dL (ref 8.9–10.3)
Chloride: 105 mmol/L (ref 98–111)
Creatinine, Ser: 0.78 mg/dL (ref 0.44–1.00)
GFR, Estimated: >60 mL/min (ref >60)
Glucose, Bld: 75 mg/dL (ref 70–99)
Potassium: 3.9 mmol/L (ref 3.5–5.1)
Sodium: 139 mmol/L (ref 135–145)
Total Bilirubin: 1.1 mg/dL (ref 0.3–1.2)
Total Protein: 8.1 g/dL (ref 6.5–8.1)

## 2021-07-26 LAB — CBC
HCT: 41.3 % (ref 36.0–46.0)
Hemoglobin: 13.4 g/dL (ref 12.0–15.0)
MCH: 30.2 pg (ref 26.0–34.0)
MCHC: 32.4 g/dL (ref 30.0–36.0)
MCV: 93.2 fL (ref 80.0–100.0)
Platelets: 327 10*3/uL (ref 150–400)
RBC: 4.43 MIL/uL (ref 3.87–5.11)
RDW: 13.9 % (ref 11.5–15.5)
WBC: 9.1 10*3/uL (ref 4.0–10.5)
nRBC: 0 % (ref 0.0–0.2)

## 2021-07-26 LAB — RESP PANEL BY RT-PCR (FLU A&B, COVID) ARPGX2
Influenza A by PCR: NEGATIVE
Influenza B by PCR: NEGATIVE
SARS Coronavirus 2 by RT PCR: NEGATIVE

## 2021-07-26 LAB — URINALYSIS, ROUTINE W REFLEX MICROSCOPIC
Bilirubin Urine: NEGATIVE
Glucose, UA: NEGATIVE mg/dL
Hgb urine dipstick: NEGATIVE
Ketones, ur: NEGATIVE mg/dL
Leukocytes,Ua: NEGATIVE
Nitrite: NEGATIVE
Protein, ur: NEGATIVE mg/dL
Specific Gravity, Urine: 1.013 (ref 1.005–1.030)
pH: 7 (ref 5.0–8.0)

## 2021-07-26 LAB — LIPASE, BLOOD: Lipase: 43 U/L (ref 11–51)

## 2021-07-26 LAB — POC URINE PREG, ED: Preg Test, Ur: NEGATIVE

## 2021-07-26 MED ORDER — PROCHLORPERAZINE MALEATE 10 MG PO TABS
10.0000 mg | ORAL_TABLET | Freq: Once | ORAL | Status: DC
  Filled 2021-07-26 (×2): qty 1

## 2021-07-26 MED ORDER — METOCLOPRAMIDE HCL 10 MG PO TABS: 10.0000 mg | ORAL_TABLET | Freq: Three times a day (TID) | ORAL | 0 refills | Status: DC

## 2021-07-26 MED ORDER — IBUPROFEN 600 MG PO TABS
600.0000 mg | ORAL_TABLET | Freq: Once | ORAL | Status: AC
  Administered 2021-07-26: 600 mg via ORAL
  Filled 2021-07-26: qty 1

## 2021-07-26 NOTE — Discharge Instructions (Addendum)
-  Take medication as needed for nausea -Return to the emergency department at any time if you begin to experience any new or worsening symptoms -Follow-up with your primary care provider as needed.

## 2021-07-26 NOTE — ED Triage Notes (Signed)
C/O nausea and headache. States was seen through ED two days ago for Vitamin D overdose.   Patient is AAOx3.  Skin warm and dry no apparent distress

## 2021-07-26 NOTE — ED Notes (Signed)
Checked all three pyxis and was unable to locate compazine for pt. Contacted pharmacy in order to send down the medication.

## 2021-07-26 NOTE — ED Provider Notes (Signed)
[facility] Provider Note    None    (approximate)   History   Chief Complaint Headache and Nausea   HPI Sherry Villarreal is a 36 y.o. female, history of psoriasis and asthma, presents to the emergency department for evaluation of headache and nausea.  Patient states that she was recently seen here in the emergency department on 07/24/2021 for incidental vitamin D overdose.  Since being discharged, she states that she has had a persistent headache associated with intermittent nausea.  Describes her headache as global sensation, 6/10, no radiation into the neck.  Denies blurred vision, hearing changes, or visual disturbances.  Denies fever/chills, chest pain, shortness of breath, abdominal pain, back pain, or urinary symptoms.  History Limitations: No limitations.      Physical Exam  Triage Vital Signs: ED Triage Vitals  Enc Vitals Group     BP 07/26/21 1505 104/63     Pulse Rate 07/26/21 1505 66     Resp 07/26/21 1505 18     Temp 07/26/21 1505 98.5 F (36.9 C)     Temp Source 07/26/21 1505 Oral     SpO2 07/26/21 1505 97 %     Weight 07/26/21 1456 160 lb 0.9 oz (72.6 kg)     Height 07/26/21 1456 5\' 4"  (1.626 m)     Head Circumference --      Peak Flow --      Pain Score 07/26/21 1455 5     Pain Loc --      Pain Edu? --      Excl. in [place]? --     Most recent vital signs: Vitals:   07/26/21 1505 07/26/21 1841  BP: 104/63 (!) 119/57  Pulse: 66 (!) 57  Resp: 18 16  Temp: 98.5 F (36.9 C) 97.7 F (36.5 C)  SpO2: 97% 99%    General: Awake, NAD.  CV: Good peripheral perfusion.  Resp: Normal effort.  Lung sounds clear bilaterally in the apices and bases. Abd: Soft, non-tender. No distention.  Neuro: At baseline. No gross neurological deficits. Other: Cranial nerves II through XII intact.  Physical Exam    ED Results / Procedures / Treatments  Labs (all labs ordered are listed, but only abnormal results are displayed) Labs Reviewed   URINALYSIS, ROUTINE W REFLEX MICROSCOPIC - Abnormal; Notable for the following components:      Result Value   Color, Urine YELLOW (*)    APPearance HAZY (*)    All other components within normal limits  RESP PANEL BY RT-PCR (FLU A&B, COVID) ARPGX2  LIPASE, BLOOD  COMPREHENSIVE METABOLIC PANEL  CBC  POC URINE PREG, ED     EKG Not applicable.   RADIOLOGY  ED Provider Interpretation: Not applicable.  No results found.  PROCEDURES:  Critical Care performed: None.  Procedures    MEDICATIONS ORDERED IN ED: Medications  prochlorperazine (COMPAZINE) tablet 10 mg (has no administration in time range)  ibuprofen (ADVIL) tablet 600 mg (600 mg Oral Given 07/26/21 1836)     IMPRESSION / MDM / ASSESSMENT AND PLAN / ED COURSE  I reviewed the triage vital signs and the nursing notes.                              Sherry Villarreal is a 36 y.o. female, history of psoriasis and asthma, presents to the emergency department for evaluation of headache and nausea.  Patient states that she was recently  seen here in the emergency department on 07/24/2021 for incidental vitamin D overdose.  Since being discharged, she states that she has had a persistent headache associated with intermittent nausea.  Describes her headache as global sensation, 6/10, no radiation into the neck  Differential diagnosis includes, but is not limited to, tension headache, migraine, COVID-19, influenza, viral URI,.  ED Course Patient appears well.  Vital signs within normal limits.  She is afebrile.  We will go ahead and treat her with ibuprofen and prochlorperazine.  CBC unremarkable for leukocytosis or anemia.  CMP shows no evidence of electrolyte abnormalities, transaminitis, or evidence of kidney injury.  Urinalysis negative for any evidence of urinary tract infection.  Lipase unremarkable at 43.  Unlikely pancreatitis.  Respiratory panel negative for COVID-19 or influenza infection.   Assessment/Plan Given  the patient's history, physical exam, and work-up thus far, I do not suspect any serious or life-threatening pathology.  Currently pending EKG and administration of prochlorperazine, however the patient states that she does not want a wait in the emergency department any longer and is requesting discharge at this time.  She states that she only came to the emergency department to receive IV fluids.  We will go ahead and discharge this patient with a prescription for metoclopramide and instructions for follow-up with her primary care provider as needed.  Patient was provided with anticipatory guidance, return precautions, and educational material. Encouraged the patient to return to the emergency department at any time if they begin to experience any new or worsening symptoms.       FINAL CLINICAL IMPRESSION(S) / ED DIAGNOSES   Final diagnoses:  Nausea  Bad headache     Rx / DC Orders   ED Discharge Orders          Ordered    metoCLOPramide (REGLAN) 10 MG tablet  3 times daily with meals        07/26/21 1911             Note:  This document was prepared using Dragon voice recognition software and may include unintentional dictation errors.   Teodoro Spray, [place] 07/26/21 2151    Rada Hay, MD 07/26/21 2245

## 2021-07-26 NOTE — ED Notes (Signed)
Pt stuck unsuccessfully for blood. Lab called to ome to obtain blood sample.

## 2021-12-03 ENCOUNTER — Ambulatory Visit: Payer: Self-pay

## 2021-12-06 ENCOUNTER — Emergency Department
Admission: EM | Admit: 2021-12-06 | Discharge: 2021-12-06 | Disposition: A | Discharge status: Home / Self Care | Payer: Managed Care, Other (non HMO) | Attending: Emergency Medicine | Admitting: Emergency Medicine

## 2021-12-06 ENCOUNTER — Other Ambulatory Visit: Payer: Self-pay

## 2021-12-06 ENCOUNTER — Emergency Department: Payer: Managed Care, Other (non HMO)

## 2021-12-06 DIAGNOSIS — R1030 Lower abdominal pain, unspecified: Secondary | ICD-10-CM | POA: Insufficient documentation

## 2021-12-06 LAB — CBC
HCT: 38.9 % (ref 36.0–46.0)
Hemoglobin: 12.4 g/dL (ref 12.0–15.0)
MCH: 29.7 pg (ref 26.0–34.0)
MCHC: 31.9 g/dL (ref 30.0–36.0)
MCV: 93.1 fL (ref 80.0–100.0)
Platelets: 157 10*3/uL (ref 150–400)
RBC: 4.18 MIL/uL (ref 3.87–5.11)
RDW: 13.4 % (ref 11.5–15.5)
WBC: 8.5 10*3/uL (ref 4.0–10.5)
nRBC: 0 % (ref 0.0–0.2)

## 2021-12-06 LAB — URINALYSIS, ROUTINE W REFLEX MICROSCOPIC
Bilirubin Urine: NEGATIVE
Glucose, UA: NEGATIVE mg/dL
Hgb urine dipstick: NEGATIVE
Ketones, ur: NEGATIVE mg/dL
Leukocytes,Ua: NEGATIVE
Nitrite: NEGATIVE
Protein, ur: NEGATIVE mg/dL
Specific Gravity, Urine: 1.029 (ref 1.005–1.030)
pH: 5 (ref 5.0–8.0)

## 2021-12-06 LAB — COMPREHENSIVE METABOLIC PANEL
ALT: 18 U/L (ref 0–44)
AST: 20 U/L (ref 15–41)
Albumin: 4.1 g/dL (ref 3.5–5.0)
Alkaline Phosphatase: 54 U/L (ref 38–126)
Anion gap: 7 (ref 5–15)
BUN: 17 mg/dL (ref 6–20)
CO2: 24 mmol/L (ref 22–32)
Calcium: 9.5 mg/dL (ref 8.9–10.3)
Chloride: 107 mmol/L (ref 98–111)
Creatinine, Ser: 0.63 mg/dL (ref 0.44–1.00)
GFR, Estimated: >60 mL/min (ref >60)
Glucose, Bld: 101 mg/dL — ABNORMAL HIGH (ref 70–99)
Potassium: 4.3 mmol/L (ref 3.5–5.1)
Sodium: 138 mmol/L (ref 135–145)
Total Bilirubin: 0.6 mg/dL (ref 0.3–1.2)
Total Protein: 7.6 g/dL (ref 6.5–8.1)

## 2021-12-06 LAB — POC URINE PREG, ED: Preg Test, Ur: NEGATIVE

## 2021-12-06 LAB — LIPASE, BLOOD: Lipase: 40 U/L (ref 11–51)

## 2021-12-06 MED ORDER — HYDROCODONE-ACETAMINOPHEN 5-325 MG PO TABS: 1.0000 | ORAL_TABLET | Freq: Four times a day (QID) | ORAL | 0 refills | Status: DC | PRN

## 2021-12-06 MED ORDER — MORPHINE SULFATE (PF) 4 MG/ML IV SOLN
4.0000 mg | Freq: Once | INTRAVENOUS | Status: AC
  Administered 2021-12-06: 4 mg via INTRAVENOUS
  Filled 2021-12-06: qty 1

## 2021-12-06 MED ORDER — ONDANSETRON HCL 4 MG/2ML IJ SOLN
4.0000 mg | Freq: Once | INTRAMUSCULAR | Status: AC
  Administered 2021-12-06: 4 mg via INTRAVENOUS
  Filled 2021-12-06: qty 2

## 2021-12-06 MED ORDER — IOHEXOL 300 MG/ML  SOLN
100.0000 mL | Freq: Once | INTRAMUSCULAR | Status: AC | PRN
  Administered 2021-12-06: 100 mL via INTRAVENOUS

## 2021-12-06 NOTE — ED Triage Notes (Signed)
Pt in with lower abd pain and rectal pain x 1 week. Hx of endometriosis with hx of same symptoms.

## 2021-12-06 NOTE — ED Provider Notes (Signed)
[facility] Provider Note    Event Date/Time   First MD Initiated Contact with Patient 12/06/21 [phone]     (approximate)   History   Abdominal Pain   HPI  Sherry Villarreal is a 36 y.o. female patient reports lower abdominal pain.  Pain is worse when she is going to have a bowel movement she can feel the stool moving into the rectum and she has a bowel movement.  This is something similar to the pain she has had with her endometriosis but the endometriosis pain had been under control so she is not sure why this is going on.  She still taking the Aygestin which has been working for her endometriosis.  Patient has no urgency frequency or dysuria.  She is not worried about any STD.  She is not having any vaginal discharge either.      Physical Exam   Triage Vital Signs: ED Triage Vitals  Enc Vitals Group     BP 12/06/21 0732 116/84     Pulse Rate 12/06/21 0732 71     Resp 12/06/21 0732 18     Temp 12/06/21 0732 98.1 F (36.7 C)     Temp Source 12/06/21 0732 Oral     SpO2 12/06/21 0732 100 %     Weight 12/06/21 0730 160 lb (72.6 kg)     Height 12/06/21 0730 '5\' 4"'$  (1.626 m)     Head Circumference --      Peak Flow --      Pain Score 12/06/21 0730 8     Pain Loc --      Pain Edu? --      Excl. in [place]? --     Most recent vital signs: Vitals:   12/06/21 0732 12/06/21 1127  BP: 116/84 108/65  Pulse: 71 (!) 55  Resp: 18 16  Temp: 98.1 F (36.7 C)   SpO2: 100% 97%     General: Awake, no distress.  Mouth: No erythema or exudate CV:  Good peripheral perfusion.  Heart regular rate and rhythm no audible murmur Resp:  Normal effort.  Lungs are clear Abd:  No distention.  Soft nontender even suprapubically.  There is no CVA tenderness. Extremities: No edema   ED Results / Procedures / Treatments   Labs (all labs ordered are listed, but only abnormal results are displayed) Labs Reviewed  COMPREHENSIVE METABOLIC PANEL - Abnormal; Notable for the  following components:      Result Value   Glucose, Bld 101 (*)    All other components within normal limits  URINALYSIS, ROUTINE W REFLEX MICROSCOPIC - Abnormal; Notable for the following components:   Color, Urine YELLOW (*)    APPearance HAZY (*)    All other components within normal limits  CBC  LIPASE, BLOOD  CBC WITH DIFFERENTIAL/PLATELET  DIFFERENTIAL  POC URINE PREG, ED     EKG     RADIOLOGY CT read by radiology reviewed by me shows no acute disease radiologist sees a dilated pancreatic duct but the patient does not have any pain anywhere in that area.  Additionally patient's LFTs and lipase are normal.   PROCEDURES:  Critical Care performed:   Procedures   MEDICATIONS ORDERED IN ED: Medications  morphine (PF) 4 MG/ML injection 4 mg (4 mg Intravenous Given 12/06/21 1031)  ondansetron (ZOFRAN) injection 4 mg (4 mg Intravenous Given 12/06/21 1032)  iohexol (OMNIPAQUE) 300 MG/ML solution 100 mL (100 mLs Intravenous Contrast Given 12/06/21 1048)  IMPRESSION / MDM / ASSESSMENT AND PLAN / ED COURSE  I reviewed the triage vital signs and the nursing notes.  Patient with no obvious obstruction or any other acute pathology found on CT urinalysis or lab work.  I will have her follow-up with GYN and also with GI.  They can further evaluate her to see if there is any obvious reason for her pain.  I will give her just a little bit of pain medicine for the next day or 2.   Patient's presentation is most consistent with acute complicated illness / injury requiring diagnostic workup.  The patient is on the cardiac monitor to evaluate for evidence of arrhythmia and/or significant heart rate changes.  None were seen      FINAL CLINICAL IMPRESSION(S) / ED DIAGNOSES   Final diagnoses:  Lower abdominal pain     Rx / DC Orders   ED Discharge Orders     None        Note:  This document was prepared using Dragon voice recognition software and may include  unintentional dictation errors.   Nena Polio, MD 12/06/21 [phone]

## 2021-12-06 NOTE — Discharge Instructions (Addendum)
CT is negative it does not show any blockages.  I will have you follow-up with gastroenterology and I would also request that you follow-up again with GYN.  Dr. Vicente Males is on-call for gastroenterology he is very good.  If you call his office that should be to get you a follow-up appointment fairly soon.  I will give you just a little bit of pain medication for the next day or so if you need it.  Please take the Vicodin 1 pill 4 times a day if needed for pain.  Please do not hesitate to return for increasing pain or fever or vomiting.

## 2021-12-06 NOTE — ED Notes (Signed)
IV team at bedside 

## 2021-12-08 LAB — CBC WITH DIFFERENTIAL/PLATELET
Abs Immature Granulocytes: 0.02 10*3/uL (ref 0.00–0.07)
Basophils Absolute: 0.1 10*3/uL (ref 0.0–0.1)
Basophils Relative: 1 %
Eosinophils Absolute: 0.1 10*3/uL (ref 0.0–0.5)
Eosinophils Relative: 1 %
HCT: 38.3 % (ref 36.0–46.0)
Hemoglobin: 12.2 g/dL (ref 12.0–15.0)
Immature Granulocytes: 0 %
Lymphocytes Relative: 43 %
Lymphs Abs: 3.6 10*3/uL (ref 0.7–4.0)
MCH: 29.6 pg (ref 26.0–34.0)
MCHC: 31.9 g/dL (ref 30.0–36.0)
MCV: 93 fL (ref 80.0–100.0)
Monocytes Absolute: 0.6 10*3/uL (ref 0.1–1.0)
Monocytes Relative: 7 %
Neutro Abs: 4 10*3/uL (ref 1.7–7.7)
Neutrophils Relative %: 48 %
Platelets: 201 10*3/uL (ref 150–400)
RBC: 4.12 MIL/uL (ref 3.87–5.11)
RDW: 13.6 % (ref 11.5–15.5)
WBC: 8.4 10*3/uL (ref 4.0–10.5)
nRBC: 0 % (ref 0.0–0.2)

## 2022-01-11 ENCOUNTER — Other Ambulatory Visit: Payer: Self-pay

## 2022-01-11 ENCOUNTER — Emergency Department
Admission: EM | Admit: 2022-01-11 | Discharge: 2022-01-11 | Disposition: A | Discharge status: Home / Self Care | Payer: Managed Care, Other (non HMO) | Attending: Emergency Medicine | Admitting: Emergency Medicine

## 2022-01-11 ENCOUNTER — Encounter: Payer: Self-pay | Admitting: Emergency Medicine

## 2022-01-11 ENCOUNTER — Emergency Department: Payer: Managed Care, Other (non HMO)

## 2022-01-11 DIAGNOSIS — E876 Hypokalemia: Secondary | ICD-10-CM | POA: Diagnosis not present

## 2022-01-11 DIAGNOSIS — R102 Pelvic and perineal pain: Secondary | ICD-10-CM | POA: Diagnosis present

## 2022-01-11 DIAGNOSIS — D259 Leiomyoma of uterus, unspecified: Secondary | ICD-10-CM | POA: Diagnosis not present

## 2022-01-11 DIAGNOSIS — D219 Benign neoplasm of connective and other soft tissue, unspecified: Secondary | ICD-10-CM

## 2022-01-11 DIAGNOSIS — R079 Chest pain, unspecified: Secondary | ICD-10-CM | POA: Diagnosis not present

## 2022-01-11 DIAGNOSIS — K5900 Constipation, unspecified: Secondary | ICD-10-CM | POA: Insufficient documentation

## 2022-01-11 LAB — IRON AND TIBC
Iron: 66 ug/dL (ref 28–170)
Saturation Ratios: 25 % (ref 10.4–31.8)
TIBC: 267 ug/dL (ref 250–450)
UIBC: 201 ug/dL

## 2022-01-11 LAB — URINALYSIS, COMPLETE (UACMP) WITH MICROSCOPIC
Bacteria, UA: NONE SEEN
Bilirubin Urine: NEGATIVE
Glucose, UA: NEGATIVE mg/dL
Hgb urine dipstick: NEGATIVE
Ketones, ur: NEGATIVE mg/dL
Leukocytes,Ua: NEGATIVE
Nitrite: NEGATIVE
Protein, ur: NEGATIVE mg/dL
Specific Gravity, Urine: 1.011 (ref 1.005–1.030)
pH: 8 (ref 5.0–8.0)

## 2022-01-11 LAB — CBC
HCT: 32.3 % — ABNORMAL LOW (ref 36.0–46.0)
Hemoglobin: 10 g/dL — ABNORMAL LOW (ref 12.0–15.0)
MCH: 29.4 pg (ref 26.0–34.0)
MCHC: 31 g/dL (ref 30.0–36.0)
MCV: 95 fL (ref 80.0–100.0)
Platelets: 231 10*3/uL (ref 150–400)
RBC: 3.4 MIL/uL — ABNORMAL LOW (ref 3.87–5.11)
RDW: 13.6 % (ref 11.5–15.5)
WBC: 8.4 10*3/uL (ref 4.0–10.5)
nRBC: 0 % (ref 0.0–0.2)

## 2022-01-11 LAB — COMPREHENSIVE METABOLIC PANEL
ALT: 16 U/L (ref 0–44)
AST: 18 U/L (ref 15–41)
Albumin: 3.4 g/dL — ABNORMAL LOW (ref 3.5–5.0)
Alkaline Phosphatase: 50 U/L (ref 38–126)
Anion gap: 6 (ref 5–15)
BUN: 11 mg/dL (ref 6–20)
CO2: 21 mmol/L — ABNORMAL LOW (ref 22–32)
Calcium: 7.7 mg/dL — ABNORMAL LOW (ref 8.9–10.3)
Chloride: 113 mmol/L — ABNORMAL HIGH (ref 98–111)
Creatinine, Ser: 0.55 mg/dL (ref 0.44–1.00)
GFR, Estimated: >60 mL/min (ref >60)
Glucose, Bld: 88 mg/dL (ref 70–99)
Potassium: 3.4 mmol/L — ABNORMAL LOW (ref 3.5–5.1)
Sodium: 140 mmol/L (ref 135–145)
Total Bilirubin: 0.8 mg/dL (ref 0.3–1.2)
Total Protein: 6.3 g/dL — ABNORMAL LOW (ref 6.5–8.1)

## 2022-01-11 LAB — WET PREP, GENITAL
Clue Cells Wet Prep HPF POC: NONE SEEN
Sperm: NONE SEEN
Trich, Wet Prep: NONE SEEN
WBC, Wet Prep HPF POC: <10 (ref <10)
Yeast Wet Prep HPF POC: NONE SEEN

## 2022-01-11 LAB — CHLAMYDIA/NGC RT PCR (ARMC ONLY)
Chlamydia Tr: NOT DETECTED
N gonorrhoeae: NOT DETECTED

## 2022-01-11 LAB — LIPASE, BLOOD: Lipase: 27 U/L (ref 11–51)

## 2022-01-11 LAB — POC URINE PREG, ED: Preg Test, Ur: NEGATIVE

## 2022-01-11 LAB — TROPONIN I (HIGH SENSITIVITY): Troponin I (High Sensitivity): 3 ng/L (ref <18)

## 2022-01-11 MED ORDER — KETOROLAC TROMETHAMINE 15 MG/ML IJ SOLN
15.0000 mg | Freq: Once | INTRAMUSCULAR | Status: AC
  Administered 2022-01-11: 15 mg via INTRAVENOUS
  Filled 2022-01-11: qty 1

## 2022-01-11 MED ORDER — POTASSIUM CHLORIDE CRYS ER 20 MEQ PO TBCR
40.0000 meq | EXTENDED_RELEASE_TABLET | Freq: Once | ORAL | Status: AC
  Administered 2022-01-11: 40 meq via ORAL
  Filled 2022-01-11: qty 2

## 2022-01-11 MED ORDER — ONDANSETRON HCL 4 MG/2ML IJ SOLN
4.0000 mg | Freq: Once | INTRAMUSCULAR | Status: AC
  Administered 2022-01-11: 4 mg via INTRAVENOUS
  Filled 2022-01-11: qty 2

## 2022-01-11 MED ORDER — HYDROCODONE-ACETAMINOPHEN 5-325 MG PO TABS: 1.0000 | ORAL_TABLET | Freq: Four times a day (QID) | ORAL | 0 refills | Status: AC | PRN

## 2022-01-11 MED ORDER — MORPHINE SULFATE (PF) 4 MG/ML IV SOLN
4.0000 mg | Freq: Once | INTRAVENOUS | Status: AC
  Administered 2022-01-11: 4 mg via INTRAVENOUS
  Filled 2022-01-11: qty 1

## 2022-01-11 NOTE — ED Triage Notes (Signed)
Pt reports lower abdominal pain that radiates to her back. Pt reports she was seen for the same 2 days ago and was told she had fibroids near her rectum. Pt with hx of endometriosis. Pt reports she had vaginal bleeding that started today and was told to come to ER.

## 2022-01-11 NOTE — ED Provider Notes (Signed)
[facility] Provider Note    Event Date/Time   First MD Initiated Contact with Patient 01/11/22 1331     (approximate)   History   Abdominal Pain   HPI  Sherry Villarreal is a 36 y.o. female with a past medical history of asthma, psoriasis, RA on methotrexate and uterine fibroids who presents for evaluation of some ongoing pelvic pain rating to her back associate with constipation.  States it has been going on for several weeks but has gotten particular severe over the last couple days.  She also endorses couple days of chest tightness.  No fevers, cough, vomiting, upper abdominal pain or other back pain or urinary symptoms.  She states she has noticed some vaginal bleeding over the last day or so.  No other abnormal discharge.  She has been taking naproxen and took some leftover Norco couple days ago but this did not significantly help.  She is taking MiraLAX for constipation but is still been a couple days and she last had a bowel movement.  She notes she was seen at [facility] on 7/25 and had a CT of the abdomen pelvis done that showed she had a fibroid as well as a recent ultrasound done on 7/19 that also showed findings concerning for a fibroid and was supposed to have a follow-up visit via video with her gynecologist later today but felt her pain was uncontrolled.  No other acute symptoms at this time.  No significant EtOH use or illicit drug use.    Past Medical History:  Diagnosis Date   Asthma    Endometriosis    Psoriasis    Rheumatoid arthritis ([place])      Physical Exam  Triage Vital Signs: ED Triage Vitals  Enc Vitals Group     BP 01/11/22 1322 127/81     Pulse Rate 01/11/22 1322 78     Resp 01/11/22 1322 15     Temp 01/11/22 1322 99 F (37.2 C)     Temp Source 01/11/22 1322 Oral     SpO2 01/11/22 1322 100 %     Weight --      Height --      Head Circumference --      Peak Flow --      Pain Score 01/11/22 1321 10     Pain Loc --      Pain Edu?  --      Excl. in [place]? --     Most recent vital signs: Vitals:   01/11/22 1322 01/11/22 1530  BP: 127/81 107/83  Pulse: 78 69  Resp: 15 18  Temp: 99 F (37.2 C)   SpO2: 100% 100%    General: Awake, patient is uncomfortable and crying. CV:  Good peripheral perfusion.  2+ radial pulses.  No murmur. Resp:  Normal effort.  Clear bilaterally.  No significant respiratory distress. Abd:  No distention.  Soft throughout.  Some mild suprapubic tenderness.  No significant CVA tenderness Other:  Chaperoned pelvic exam is remarkable for some dried dark blood at the os without any active bleeding or  Blood.  No significant purulence or friability or other abnormalities noted.   ED Results / Procedures / Treatments  Labs (all labs ordered are listed, but only abnormal results are displayed) Labs Reviewed  COMPREHENSIVE METABOLIC PANEL - Abnormal; Notable for the following components:      Result Value   Potassium 3.4 (*)    Chloride 113 (*)    CO2 21 (*)  Calcium 7.7 (*)    Total Protein 6.3 (*)    Albumin 3.4 (*)    All other components within normal limits  CBC - Abnormal; Notable for the following components:   RBC 3.40 (*)    Hemoglobin 10.0 (*)    HCT 32.3 (*)    All other components within normal limits  URINALYSIS, COMPLETE (UACMP) WITH MICROSCOPIC - Abnormal; Notable for the following components:   Color, Urine YELLOW (*)    APPearance CLEAR (*)    All other components within normal limits  WET PREP, GENITAL  CHLAMYDIA/NGC RT PCR ([facility] ONLY)            LIPASE, BLOOD  IRON AND TIBC  POC URINE PREG, ED  TROPONIN I (HIGH SENSITIVITY)     EKG  EKG is remarkable for sinus rhythm with a ventricular rate of 76, normal axis, unremarkable intervals without clear evidence of acute ischemia or significant arrhythmia.  RADIOLOGY Chest reviewed by myself shows no focal consoidation, effusion, edema, pneumothorax or other clear acute thoracic process. I also reviewed radiology  interpretation and agree with findings described.    PROCEDURES:  Critical Care performed: No  Procedures   MEDICATIONS ORDERED IN ED: Medications  morphine (PF) 4 MG/ML injection 4 mg (4 mg Intravenous Given 01/11/22 1442)  ketorolac (TORADOL) 15 MG/ML injection 15 mg (15 mg Intravenous Given 01/11/22 1404)  ondansetron (ZOFRAN) injection 4 mg (4 mg Intravenous Given 01/11/22 1441)  potassium chloride SA (KLOR-CON M) CR tablet 40 mEq (40 mEq Oral Given 01/11/22 1534)     IMPRESSION / MDM / ASSESSMENT AND PLAN / ED COURSE  I reviewed the triage vital signs and the nursing notes. Patient's presentation is most consistent with acute presentation with potential threat to life or bodily function.                               Differential diagnosis includes, but is not limited to symptomatic uterine fibroid causing abnormal uterine bleeding, pelvic pain possibly contribute to constipation which I suspect is likely also exacerbated by opioid use that she reports she took some leftover Norco the other day.  I reviewed CT findings from 7/25 showed no evidence of an appendicitis, kidney stone, perinephric stranding, diverticulitis or any other clear acute abdominal or pelvic process given she reports the pain is in a similar location and is similar quality as at that time and overall I have a low suspicion for interim development of other emergent intra-abdominal pathology I think the risks of increased radiation from repeat CT are outweighed by any benefits at this point.  With regards to her chest tightness there is no wheezing to suggest asthma exacerbation.  Additional differential considerations include ACS, MSK, GERD and or pneumonia.  Lower suspicion for PE at this time.  EKG is remarkable for sinus rhythm with a ventricular rate of 76, normal axis, unremarkable intervals without clear evidence of acute ischemia or significant arrhythmia.  Chest reviewed by myself shows no focal  consoidation, effusion, edema, pneumothorax or other clear acute thoracic process. I also reviewed radiology interpretation and agree with findings described.  CMP is remarkable for K of 3.4 without any other significant acute electrolyte or metabolic derangements.  No evidence of acute hepatitis or cholestatic process.  Lipase WNL not suggestive of pancreatitis.  CBC without leukocytosis and hemoglobin of 10 compared to 12.52-monthago.  Patient does not otherwise seem acutely symptomatic from  this and there is no evidence of active bleeding on exam or indication for emergent transfusion.  UA has no evidence of infection.  Pregnancy test is negative.  EKG and troponin are not suggestive of ACS.  Wet prep negative.  Suspect symptoms in the pelvis likely related to symptomatic uterine fibroid causing some abnormal bleeding.  Patient is taking NSAIDs appropriately.  We will prescribe her a short course of Norco to help with breakthrough pain that she is in a fair amount of pain.  I think it is very important that she get in to see a gynecologist soon as possible.  She states has been trying but it will be at least over a month.  We will provide her a little gynecology follow-up information as well to see if they can see her sooner.  Emphasized importance of taking adequate MiraLAX to ensure she is having soft bowel movements especially with history of constipation and if she is can be taking opioids for few days.  She states she understands this.  Unclear etiology for chest discomfort at this time although given stable vitals with reassuring exam and work-up I believe she is stable for continued outpatient evaluations with regards to this.  Discharged in stable condition.  Strict and precautions advised and discussed.       FINAL CLINICAL IMPRESSION(S) / ED DIAGNOSES   Final diagnoses:  Pelvic pain  Fibroid  Constipation, unspecified constipation type  Chest pain, unspecified type  Hypokalemia      Rx / DC Orders   ED Discharge Orders          Ordered    HYDROcodone-acetaminophen (NORCO) 5-325 MG tablet  Every 6 hours PRN        01/11/22 1537             Note:  This document was prepared using Dragon voice recognition software and may include unintentional dictation errors.   Lucrezia Starch, MD 01/11/22 1538

## 2022-02-21 ENCOUNTER — Other Ambulatory Visit: Payer: Self-pay | Admitting: Gastroenterology

## 2022-02-21 DIAGNOSIS — Q453 Other congenital malformations of pancreas and pancreatic duct: Secondary | ICD-10-CM

## 2022-02-27 ENCOUNTER — Other Ambulatory Visit: Payer: Self-pay | Admitting: Gastroenterology

## 2022-02-27 ENCOUNTER — Ambulatory Visit: Payer: Managed Care, Other (non HMO)

## 2022-02-27 DIAGNOSIS — Q453 Other congenital malformations of pancreas and pancreatic duct: Secondary | ICD-10-CM

## 2022-03-15 ENCOUNTER — Other Ambulatory Visit: Payer: Managed Care, Other (non HMO)

## 2022-03-22 ENCOUNTER — Ambulatory Visit
Admission: RE | Admit: 2022-03-22 | Discharge: 2022-03-22 | Disposition: A | Discharge status: Home / Self Care | Payer: Managed Care, Other (non HMO) | Source: Ambulatory Visit | Attending: Gastroenterology | Admitting: Gastroenterology

## 2022-03-22 DIAGNOSIS — Q453 Other congenital malformations of pancreas and pancreatic duct: Secondary | ICD-10-CM

## 2022-03-22 MED ORDER — GADOBENATE DIMEGLUMINE 529 MG/ML IV SOLN
15.0000 mL | Freq: Once | INTRAVENOUS | Status: AC | PRN
  Administered 2022-03-22: 15 mL via INTRAVENOUS

## 2022-04-09 ENCOUNTER — Ambulatory Visit: Payer: Managed Care, Other (non HMO) | Admitting: Gastroenterology

## 2022-04-17 ENCOUNTER — Ambulatory Visit
Admission: RE | Admit: 2022-04-17 | Discharge: 2022-04-17 | Disposition: A | Discharge status: Home / Self Care | Payer: Managed Care, Other (non HMO) | Source: Ambulatory Visit | Attending: Physician Assistant | Admitting: Physician Assistant

## 2022-04-17 ENCOUNTER — Ambulatory Visit
Admission: RE | Admit: 2022-04-17 | Discharge: 2022-04-17 | Disposition: A | Discharge status: Home / Self Care | Payer: Managed Care, Other (non HMO) | Attending: Physician Assistant | Admitting: Physician Assistant

## 2022-04-17 ENCOUNTER — Other Ambulatory Visit: Payer: Self-pay | Admitting: Physician Assistant

## 2022-04-17 DIAGNOSIS — R053 Chronic cough: Secondary | ICD-10-CM | POA: Insufficient documentation

## 2023-12-17 ENCOUNTER — Ambulatory Visit
Admission: EM | Admit: 2023-12-17 | Discharge: 2023-12-17 | Disposition: A | Discharge status: Home / Self Care | Source: Ambulatory Visit | Attending: Emergency Medicine | Admitting: Emergency Medicine

## 2023-12-17 ENCOUNTER — Encounter: Payer: Self-pay | Admitting: Emergency Medicine

## 2023-12-17 DIAGNOSIS — N898 Other specified noninflammatory disorders of vagina: Secondary | ICD-10-CM | POA: Insufficient documentation

## 2023-12-17 DIAGNOSIS — R8290 Unspecified abnormal findings in urine: Secondary | ICD-10-CM | POA: Insufficient documentation

## 2023-12-17 LAB — POCT URINALYSIS DIP (MANUAL ENTRY)
Bilirubin, UA: NEGATIVE
Blood, UA: NEGATIVE
Glucose, UA: NEGATIVE mg/dL
Ketones, POC UA: NEGATIVE mg/dL
Leukocytes, UA: NEGATIVE
Nitrite, UA: NEGATIVE
Protein Ur, POC: NEGATIVE mg/dL
Spec Grav, UA: >=1.03 — AB (ref 1.010–1.025)
Urobilinogen, UA: 0.2 U/dL
pH, UA: 6 (ref 5.0–8.0)

## 2023-12-17 MED ORDER — METRONIDAZOLE 0.75 % VA GEL
1.0000 | Freq: Every day | VAGINAL | 0 refills | Status: DC
Start: 2023-12-17 — End: 2024-03-20

## 2023-12-17 NOTE — Discharge Instructions (Signed)
 Today you are being treated prophylactically for  Bacterial vaginosis   Urinalysis is negative, urine sample has been sent to the lab for 3 days, if bacteria grows you will be notified and you will be started on antibiotics  Start MetroGel every night before bed for 7 days,   Bacterial vaginosis which results from an overgrowth of one on several organisms that are normally present in your vagina. Vaginosis is an inflammation of the vagina that can result in discharge, itching and pain.  Labs pending 2-3 days, you will be contacted if positive for any sti and treatment will be sent to the pharmacy, you will have to return to the clinic if positive for gonorrhea to receive treatment   Please refrain from having sex until labs results, if positive please refrain from having sex until treatment complete and symptoms resolve   If positive for HIV, Syphilis, Chlamydia  gonorrhea or trichomoniasis please notify partner or partners so they may tested as well  Moving forward, it is recommended you use some form of protection against the transmission of sti infections  such as condoms or dental dams with each sexual encounter     In addition: Avoid baths, hot tubs and whirlpool spas.  Don't use scented or harsh soaps Avoid irritants. These include scented tampons and pads. Wipe from front to back after using the toilet. Don't douche. Your vagina doesn't require cleansing other than normal bathing.  Use a condom.  Wear cotton underwear, this fabric absorbs some moisture.

## 2023-12-17 NOTE — ED Provider Notes (Signed)
 Sherry Villarreal    CSN: [id number] Arrival date & time: 12/17/23  1918      History   Chief Complaint Chief Complaint  Patient presents with   Vaginal Discharge    HPI Sherry Villarreal is a 38 y.o. female.   Patient presents for evaluation of vaginal odor, mild lower abdominal pressure and discharge only present with urination present for 4 days.  Symptoms began after completion of menstrual cycle.  Denies urinary frequency, urgency, dysuria, hematuria, flank pain, fever, vaginal itching.   Past Medical History:  Diagnosis Date   Asthma    Endometriosis    Psoriasis    Rheumatoid arthritis (HCC)     Patient Active Problem List   Diagnosis Date Noted   Neck pain 11/05/2019   Chronic pelvic pain in female 10/16/2019   Suprapubic pain 12/09/2017   Amenorrhea 11/08/2017   Encounter for contraceptive management 11/08/2017   Panic attacks 04/02/2017   High risk medication use 12/26/2016   Mild persistent asthma without complication 09/28/2016   Psoriasis with arthropathy (HCC) 09/12/2016   Rheumatoid arthritis involving multiple sites with positive rheumatoid factor (HCC) 09/12/2016    Past Surgical History:  Procedure Laterality Date   ABDOMINAL SURGERY     DILATION AND CURETTAGE OF UTERUS     NO PAST SURGERIES      OB History   No obstetric history on file.      Home Medications    Prior to Admission medications   Medication Sig Start Date End Date Taking? Authorizing Provider  metroNIDAZOLE (METROGEL) 0.75 % vaginal gel Place 1 Applicatorful vaginally at bedtime. 12/17/23  Yes Sherry Villarreal, Sherry SAUNDERS, NP  albuterol  (VENTOLIN  HFA) 108 (90 Base) MCG/ACT inhaler Inhale 1-2 puffs into the lungs every 6 (six) hours as needed for wheezing or shortness of breath. 06/23/19   Sherry Villarreal  azelastine  (ASTELIN ) 0.1 % nasal spray Place 2 sprays into both nostrils 2 (two) times daily. 01/23/20   Babara, Sherry V, PA-C  clobetasol ointment (TEMOVATE) 0.05 % clobetasol 0.05 %  topical ointment    Provider, Historical, Villarreal  Clocortolone Pivalate (CLODERM) 0.1 % cream Cloderm 0.1 % topical cream    Provider, Historical, Villarreal  Desoximetasone (TOPICORT) 0.25 % LIQD Topicort 0.25 % topical spray    Provider, Historical, Villarreal  famotidine (PEPCID) 40 MG tablet famotidine 40 mg tablet    Provider, Historical, Villarreal  fluticasone  (FLONASE ) 50 MCG/ACT nasal spray Place 2 sprays into both nostrils daily. 06/23/19   Sherry Villarreal, Ashley, Villarreal  Fluticasone  Propionate, Inhal, (FLOVENT  DISKUS) 250 MCG/BLIST AEPB Inhale into the lungs. 06/29/20 06/29/21  Provider, Historical, Villarreal  folic acid (FOLVITE) 1 MG tablet Take 1 mg by mouth daily.    Provider, Historical, Villarreal  gentamicin  cream (GARAMYCIN ) 0.1 % Apply 1 application topically 2 (two) times daily. 11/06/19   Sherry Villarreal, DPM  lidocaine  (XYLOCAINE ) 2 % solution 5-15 mL gurgle as needed 01/23/20   Babara, Sherry V, PA-C  methotrexate (RHEUMATREX) 2.5 MG tablet methotrexate sodium 2.5 mg tablet 07/17/18   Provider, Historical, Villarreal  metoCLOPramide  (REGLAN ) 10 MG tablet Take 1 tablet (10 mg total) by mouth 3 (three) times daily with meals for 15 days. 07/26/21 08/10/21  Sherry Penne Ruth, PA  montelukast  (SINGULAIR ) 10 MG tablet Take 1 tablet (10 mg total) by mouth at bedtime. 06/23/19   Sherry Villarreal, Ashley, Villarreal  norethindrone (AYGESTIN) 5 MG tablet Take 5 mg by mouth daily. 03/06/21   Provider, Historical, Villarreal  Spacer/Aero-Holding Chambers (AEROCHAMBER PLUS)  inhaler Use as instructed 06/23/19   Sherry Villarreal  sulfaSALAzine (AZULFIDINE) 500 MG tablet sulfasalazine 500 mg tablet  Take 1 tablet twice a day by oral route for 30 days.  01/23/20  Provider, Historical, Villarreal    Family History Family History  Problem Relation Age of Onset   Asthma Mother    Healthy Father     Social History Social History   Tobacco Use   Smoking status: Never   Smokeless tobacco: Never  Vaping Use   Vaping status: Never Used  Substance Use Topics   Alcohol use: Not Currently    Drug use: Not Currently     Allergies   Penicillins   Review of Systems Review of Systems   Physical Exam Triage Vital Signs ED Triage Vitals  Encounter Vitals Group     BP 12/17/23 1932 119/84     Girls Systolic BP Percentile --      Girls Diastolic BP Percentile --      Boys Systolic BP Percentile --      Boys Diastolic BP Percentile --      Pulse Rate 12/17/23 1932 75     Resp 12/17/23 1932 18     Temp 12/17/23 1932 99 F (37.2 C)     Temp Source 12/17/23 1932 Oral     SpO2 12/17/23 1932 99 %     Weight --      Height --      Head Circumference --      Peak Flow --      Pain Score 12/17/23 1935 0     Pain Loc --      Pain Education --      Exclude from Growth Chart --    No data found.  Updated Vital Signs BP 119/84 (BP Location: Left Arm)   Pulse 75   Temp 99 F (37.2 C) (Oral)   Resp 18   LMP 12/11/2023 (Exact Date)   SpO2 99%   Visual Acuity Right Eye Distance:   Left Eye Distance:   Bilateral Distance:    Right Eye Near:   Left Eye Near:    Bilateral Near:     Physical Exam Constitutional:      Appearance: Normal appearance.   Eyes:     Extraocular Movements: Extraocular movements intact.   Pulmonary:     Effort: Pulmonary effort is normal.  Abdominal:     Tenderness: There is abdominal tenderness in the suprapubic area. There is no right CVA tenderness or left CVA tenderness.  Genitourinary:    Comments: deferred  Neurological:     Mental Status: She is alert and oriented to person, place, and time.      UC Treatments / Results  Labs (all labs ordered are listed, but only abnormal results are displayed) Labs Reviewed  POCT URINALYSIS DIP (MANUAL ENTRY) - Abnormal; Notable for the following components:      Result Value   Spec Grav, UA >=1.030 (*)    All other components within normal limits  URINE CULTURE  CERVICOVAGINAL ANCILLARY ONLY    EKG   Radiology No results found.  Procedures Procedures (including critical  care time)  Medications Ordered in UC Medications - No data to display  Initial Impression / Assessment and Plan / UC Course  I have reviewed the triage vital signs and the nursing notes.  Pertinent labs & imaging results that were available during my care of the patient were reviewed by me and considered in  my medical decision making (see chart for details).  Abnormal urine, vaginal odor  Urinalysis negative, sent for culture will initiate antibiotics based on results, based on symptomology most consistent with bacterial vaginosis, empirically initiating MetroGel, discussed administration and recommended supportive care with follow-up as needed Final Clinical Impressions(s) / UC Diagnoses   Final diagnoses:  Abnormal urine  Vaginal odor     Discharge Instructions      Today you are being treated prophylactically for  Bacterial vaginosis   Urinalysis is negative, urine sample has been sent to the lab for 3 days, if bacteria grows you will be notified and you will be started on antibiotics  Start MetroGel every night before bed for 7 days,   Bacterial vaginosis which results from an overgrowth of one on several organisms that are normally present in your vagina. Vaginosis is an inflammation of the vagina that can result in discharge, itching and pain.  Labs pending 2-3 days, you will be contacted if positive for any sti and treatment will be sent to the pharmacy, you will have to return to the clinic if positive for gonorrhea to receive treatment   Please refrain from having sex until labs results, if positive please refrain from having sex until treatment complete and symptoms resolve   If positive for HIV, Syphilis, Chlamydia  gonorrhea or trichomoniasis please notify partner or partners so they may tested as well  Moving forward, it is recommended you use some form of protection against the transmission of sti infections  such as condoms or dental dams with each sexual  encounter     In addition: Avoid baths, hot tubs and whirlpool spas.  Don't use scented or harsh soaps Avoid irritants. These include scented tampons and pads. Wipe from front to back after using the toilet. Don't douche. Your vagina doesn't require cleansing other than normal bathing.  Use a condom.  Wear cotton underwear, this fabric absorbs some moisture.        ED Prescriptions     Medication Sig Dispense Auth. Provider   metroNIDAZOLE (METROGEL) 0.75 % vaginal gel Place 1 Applicatorful vaginally at bedtime. 70 g Teresa Sherry SAUNDERS, NP      PDMP not reviewed this encounter.   Teresa Sherry SAUNDERS, NP 12/17/23 [phone]

## 2023-12-17 NOTE — ED Triage Notes (Signed)
 Patient reports foul odor after her cycle 4 days. Has not taken anything for it. Only notice discharge when she urinates.

## 2023-12-18 LAB — URINE CULTURE: Culture: NO GROWTH

## 2023-12-19 ENCOUNTER — Ambulatory Visit (HOSPITAL_COMMUNITY): Payer: Self-pay

## 2023-12-19 LAB — CERVICOVAGINAL ANCILLARY ONLY
Bacterial Vaginitis (gardnerella): POSITIVE — AB
Candida Glabrata: NEGATIVE
Candida Vaginitis: NEGATIVE
Chlamydia: NEGATIVE
Comment: NEGATIVE
Comment: NEGATIVE
Comment: NEGATIVE
Comment: NEGATIVE
Comment: NEGATIVE
Comment: NORMAL
Neisseria Gonorrhea: NEGATIVE
Trichomonas: NEGATIVE

## 2024-02-11 ENCOUNTER — Emergency Department: Admission: EM | Admit: 2024-02-11 | Discharge: 2024-02-12 | Disposition: A | Discharge status: Home / Self Care

## 2024-02-11 ENCOUNTER — Emergency Department

## 2024-02-11 ENCOUNTER — Other Ambulatory Visit: Payer: Self-pay

## 2024-02-11 DIAGNOSIS — R1032 Left lower quadrant pain: Secondary | ICD-10-CM | POA: Diagnosis not present

## 2024-02-11 DIAGNOSIS — O26891 Other specified pregnancy related conditions, first trimester: Secondary | ICD-10-CM | POA: Diagnosis present

## 2024-02-11 DIAGNOSIS — Z3A09 9 weeks gestation of pregnancy: Secondary | ICD-10-CM | POA: Insufficient documentation

## 2024-02-11 DIAGNOSIS — R519 Headache, unspecified: Secondary | ICD-10-CM | POA: Diagnosis not present

## 2024-02-11 DIAGNOSIS — R110 Nausea: Secondary | ICD-10-CM | POA: Diagnosis not present

## 2024-02-11 LAB — CBC
HCT: 36.7 % (ref 36.0–46.0)
Hemoglobin: 12.2 g/dL (ref 12.0–15.0)
MCH: 29.9 pg (ref 26.0–34.0)
MCHC: 33.2 g/dL (ref 30.0–36.0)
MCV: 90 fL (ref 80.0–100.0)
Platelets: 308 K/uL (ref 150–400)
RBC: 4.08 MIL/uL (ref 3.87–5.11)
RDW: 12.8 % (ref 11.5–15.5)
WBC: 10.4 K/uL (ref 4.0–10.5)
nRBC: 0 % (ref 0.0–0.2)

## 2024-02-11 LAB — URINALYSIS, ROUTINE W REFLEX MICROSCOPIC
Bilirubin Urine: NEGATIVE
Glucose, UA: NEGATIVE mg/dL
Hgb urine dipstick: NEGATIVE
Ketones, ur: NEGATIVE mg/dL
Leukocytes,Ua: NEGATIVE
Nitrite: NEGATIVE
Protein, ur: NEGATIVE mg/dL
Specific Gravity, Urine: 1.023 (ref 1.005–1.030)
pH: 6 (ref 5.0–8.0)

## 2024-02-11 LAB — COMPREHENSIVE METABOLIC PANEL WITH GFR
ALT: 10 U/L (ref 0–44)
AST: 18 U/L (ref 15–41)
Albumin: 3.5 g/dL (ref 3.5–5.0)
Alkaline Phosphatase: 39 U/L (ref 38–126)
Anion gap: 9 (ref 5–15)
BUN: 6 mg/dL (ref 6–20)
CO2: 21 mmol/L — ABNORMAL LOW (ref 22–32)
Calcium: 9 mg/dL (ref 8.9–10.3)
Chloride: 105 mmol/L (ref 98–111)
Creatinine, Ser: 0.54 mg/dL (ref 0.44–1.00)
GFR, Estimated: >60 mL/min (ref >60)
Glucose, Bld: 87 mg/dL (ref 70–99)
Potassium: 3.5 mmol/L (ref 3.5–5.1)
Sodium: 135 mmol/L (ref 135–145)
Total Bilirubin: 0.6 mg/dL (ref 0.0–1.2)
Total Protein: 6.9 g/dL (ref 6.5–8.1)

## 2024-02-11 LAB — HCG, QUANTITATIVE, PREGNANCY: hCG, Beta Chain, Quant, S: >250000 m[IU]/mL — ABNORMAL HIGH (ref <5)

## 2024-02-11 LAB — RESP PANEL BY RT-PCR (RSV, FLU A&B, COVID)  RVPGX2
Influenza A by PCR: NEGATIVE
Influenza B by PCR: NEGATIVE
Resp Syncytial Virus by PCR: NEGATIVE
SARS Coronavirus 2 by RT PCR: NEGATIVE

## 2024-02-11 LAB — LIPASE, BLOOD: Lipase: 33 U/L (ref 11–51)

## 2024-02-11 MED ORDER — METOCLOPRAMIDE HCL 5 MG/ML IJ SOLN
10.0000 mg | Freq: Once | INTRAMUSCULAR | Status: AC
  Administered 2024-02-11: 10 mg via INTRAVENOUS
  Filled 2024-02-11: qty 2

## 2024-02-11 MED ORDER — DOXYLAMINE-PYRIDOXINE 10-10 MG PO TBEC: 2.0000 | DELAYED_RELEASE_TABLET | Freq: Every day | ORAL | 0 refills | Status: AC

## 2024-02-11 MED ORDER — PRENATAL VITAMIN 27-0.8 MG PO TABS: 1.0000 | ORAL_TABLET | Freq: Every day | ORAL | 0 refills | Status: DC

## 2024-02-11 MED ORDER — ONDANSETRON HCL 4 MG/2ML IJ SOLN: 4.0000 mg | Freq: Once | INTRAMUSCULAR | Status: DC

## 2024-02-11 MED ORDER — ACETAMINOPHEN 500 MG PO TABS
1000.0000 mg | ORAL_TABLET | Freq: Once | ORAL | Status: AC
  Administered 2024-02-11: 1000 mg via ORAL
  Filled 2024-02-11: qty 2

## 2024-02-11 MED ORDER — SODIUM CHLORIDE 0.9 % IV BOLUS
1000.0000 mL | Freq: Once | INTRAVENOUS | Status: AC
  Administered 2024-02-11: 1000 mL via INTRAVENOUS

## 2024-02-11 NOTE — ED Triage Notes (Signed)
 Pt comes in via pov with complaints of left lower abdominal pain that started two days ago. Pt reports nausea, headache, no vomiting, and pain 2/10. Pt unsure of pregnancy, but states that it is a possibility.

## 2024-02-11 NOTE — Discharge Instructions (Signed)
 Evaluation in the emergency department was notable for a positive pregnancy test, and an ultrasound confirms that you are about [redacted] weeks gestation.  I have started you on a prenatal vitamin and also prescribed you medication that can help with your nausea.  Please follow-up with your OB/GYN provider as well as your primary care provider for reevaluation, and return to the emergency department with any new or worsening symptoms.  You can use Tylenol  as needed for any minor discomfort.

## 2024-02-11 NOTE — ED Notes (Addendum)
 Pt coming up to nurses station requesting her discharge paperwork. This RN made patient aware that she has not been set for discharge yet by MD. Secure chat sent to doctor.

## 2024-02-11 NOTE — ED Provider Notes (Signed)
 [facility] Provider Note    Event Date/Time   First MD Initiated Contact with Patient 02/11/24 1846     (approximate)   History   Abdominal Pain  Pt comes in via pov with complaints of left lower abdominal pain that started two days ago. Pt reports nausea, headache, no vomiting, and pain 2/10. Pt unsure of pregnancy, but states that it is a possibility.    HPI Sherry Villarreal is a 38 y.o. female PMH endometriosis, rheumatoid arthritis, chronic pelvic pain, asthma presents for evaluation of multiple complaints - Patient has been feeling vaguely unwell for the past 2 weeks.  Nausea, decreased p.o. intake.  Has also been having some left lower quadrant/left suprapubic discomfort.  No urinary symptoms.  Also complains of headache, nonacute onset, not worst headache of life.  No fevers.  No preceding trauma. -No vaginal bleeding or discharge -LMP 12/11/2023, says she has irregular periods at times     Physical Exam   Triage Vital Signs: ED Triage Vitals  Encounter Vitals Group     BP 02/11/24 1806 119/72     Girls Systolic BP Percentile --      Girls Diastolic BP Percentile --      Boys Systolic BP Percentile --      Boys Diastolic BP Percentile --      Pulse Rate 02/11/24 1806 89     Resp 02/11/24 1806 17     Temp 02/11/24 1806 98.2 F (36.8 C)     Temp Source 02/11/24 1806 Oral     SpO2 --      Weight 02/11/24 1807 160 lb 0.9 oz (72.6 kg)     Height 02/11/24 1807 5' 4 (1.626 m)     Head Circumference --      Peak Flow --      Pain Score 02/11/24 1807 2     Pain Loc --      Pain Education --      Exclude from Growth Chart --     Most recent vital signs: Vitals:   02/11/24 2300 02/11/24 2330  BP: (!) 106/92 (!) 100/42  Pulse: 69 84  Resp:    Temp:    SpO2: 100% 100%     General: Awake, no distress.  CV:  Good peripheral perfusion. RRR, RP 2+ Resp:  Normal effort. CTAB Abd:  No distention.  Trace tenderness in left lower quadrant only,  no tenderness elsewhere, none peritonitic exam.  No CVA tenderness. Other:     ED Results / Procedures / Treatments   Labs (all labs ordered are listed, but only abnormal results are displayed) Labs Reviewed  COMPREHENSIVE METABOLIC PANEL WITH GFR - Abnormal; Notable for the following components:      Result Value   CO2 21 (*)    All other components within normal limits  URINALYSIS, ROUTINE W REFLEX MICROSCOPIC - Abnormal; Notable for the following components:   Color, Urine YELLOW (*)    APPearance CLEAR (*)    All other components within normal limits  HCG, QUANTITATIVE, PREGNANCY - Abnormal; Notable for the following components:   hCG, Beta Chain, Quant, S >250,000 (*)    All other components within normal limits  RESP PANEL BY RT-PCR (RSV, FLU A&B, COVID)  RVPGX2  LIPASE, BLOOD  CBC     EKG  N/a   RADIOLOGY Radiology interpreted by myself radiology report reviewed.  Intrauterine pregnancy at about 9 weeks.  Apparent left ovarian cysts with no evidence  of any complications at this time.    PROCEDURES:  Critical Care performed: No  Procedures   MEDICATIONS ORDERED IN ED: Medications  sodium chloride  0.9 % bolus 1,000 mL (0 mLs Intravenous Stopped 02/11/24 2316)  acetaminophen  (TYLENOL ) tablet 1,000 mg (1,000 mg Oral Given 02/11/24 2040)  metoCLOPramide  (REGLAN ) injection 10 mg (10 mg Intravenous Given 02/11/24 2046)     IMPRESSION / MDM / ASSESSMENT AND PLAN / ED COURSE  I reviewed the triage vital signs and the nursing notes.                              DDX/MDM/AP: Differential diagnosis includes, but is not limited to, viral syndrome, consider underlying anemia or electrolyte abnormality, do not clinically suspect acute intra-abdominal pathology at this time including appendicitis or diverticulitis, consider underlying UTI, consider occult pregnancy, doubt ruptured ectopic however.  Plan: - Labs - Antiemetic, IV fluid, Tylenol  - No indication for  advanced imaging on initial eval - Reassess  Patient's presentation is most consistent with acute presentation with potential threat to life or bodily function.  The patient is on the cardiac monitor to evaluate for evidence of arrhythmia and/or significant heart rate changes.  ED course below.  hCG markedly positive, escalated to pelvic/OB ultrasound, confirms IUP at about 9 weeks.  Suspect this is etiology of patient's presentation.  Does have possible left ovarian cyst, no evidence of acute pathology at this time.  This was an unplanned pregnancy, patient is unsure if she would like to continue with this.  Offered OB/GYN referral though she prefers to follow-up with 1 on her own.  Prescribed prenatal vitamin as well as Diclegis  for her nausea.  Plan for PMD and OB/GYN follow-up.  ED return precautions in place.  Patient agrees with plan.  Clinical Course as of 02/11/24 2346  Tue Feb 11, 2024  1923 Cbc wnl [MM]  2014 HCG, Dorthea Denis Earleen GORMAN(!): >250,000 [MM]  2017 CMP reviewed, unremarkable [MM]  2123 UA w/ no e/o infxn [MM]  2207 Viral swab neg [MM]  2242 US : IMPRESSION: 1. Single, viable intrauterine pregnancy at approximately 9 weeks and 0 days gestation by ultrasound evaluation. 2. Hypoechoic area within the left ovary that may represent a complex ovarian cyst. Correlation with 6-12 week follow-up pelvic ultrasound is recommended to determine stability.   [MM]    Clinical Course User Index [MM] Clarine Ozell LABOR, MD     FINAL CLINICAL IMPRESSION(S) / ED DIAGNOSES   Final diagnoses:  [redacted] weeks gestation of pregnancy  Nausea     Rx / DC Orders   ED Discharge Orders          Ordered    Doxylamine -Pyridoxine  (DICLEGIS ) 10-10 MG TBEC  Daily at bedtime        02/11/24 2344    Prenatal Vit-Fe Fumarate-FA (PRENATAL VITAMIN) 27-0.8 MG TABS  Daily        02/11/24 2344             Note:  This document was prepared using Dragon voice recognition software and may  include unintentional dictation errors.   Clarine Ozell LABOR, MD 02/11/24 [phone]

## 2024-02-11 NOTE — ED Notes (Signed)
 Pt requesting to have IV removed. This RN in room. Pt IV removed by this RN.

## 2024-02-11 NOTE — ED Notes (Addendum)
Pt given a urine cup to provide sample

## 2024-03-10 ENCOUNTER — Other Ambulatory Visit: Payer: Self-pay

## 2024-03-10 ENCOUNTER — Emergency Department

## 2024-03-10 ENCOUNTER — Emergency Department
Admission: EM | Admit: 2024-03-10 | Discharge: 2024-03-11 | Disposition: A | Discharge status: Home / Self Care | Attending: Emergency Medicine | Admitting: Emergency Medicine

## 2024-03-10 DIAGNOSIS — O034 Incomplete spontaneous abortion without complication: Secondary | ICD-10-CM

## 2024-03-10 DIAGNOSIS — J45909 Unspecified asthma, uncomplicated: Secondary | ICD-10-CM | POA: Diagnosis not present

## 2024-03-10 DIAGNOSIS — O0289 Other abnormal products of conception: Secondary | ICD-10-CM | POA: Diagnosis present

## 2024-03-10 DIAGNOSIS — N898 Other specified noninflammatory disorders of vagina: Secondary | ICD-10-CM

## 2024-03-10 LAB — CBC
HCT: 36.6 % (ref 36.0–46.0)
Hemoglobin: 12 g/dL (ref 12.0–15.0)
MCH: 29.9 pg (ref 26.0–34.0)
MCHC: 32.8 g/dL (ref 30.0–36.0)
MCV: 91.3 fL (ref 80.0–100.0)
Platelets: 336 K/uL (ref 150–400)
RBC: 4.01 MIL/uL (ref 3.87–5.11)
RDW: 13.2 % (ref 11.5–15.5)
WBC: 8.2 K/uL (ref 4.0–10.5)
nRBC: 0 % (ref 0.0–0.2)

## 2024-03-10 LAB — RESP PANEL BY RT-PCR (RSV, FLU A&B, COVID)  RVPGX2
Influenza A by PCR: NEGATIVE
Influenza B by PCR: NEGATIVE
Resp Syncytial Virus by PCR: NEGATIVE
SARS Coronavirus 2 by RT PCR: NEGATIVE

## 2024-03-10 LAB — BASIC METABOLIC PANEL WITH GFR
Anion gap: 7 (ref 5–15)
BUN: 8 mg/dL (ref 6–20)
CO2: 24 mmol/L (ref 22–32)
Calcium: 9 mg/dL (ref 8.9–10.3)
Chloride: 104 mmol/L (ref 98–111)
Creatinine, Ser: 0.8 mg/dL (ref 0.44–1.00)
GFR, Estimated: >60 mL/min (ref >60)
Glucose, Bld: 93 mg/dL (ref 70–99)
Potassium: 3.4 mmol/L — ABNORMAL LOW (ref 3.5–5.1)
Sodium: 135 mmol/L (ref 135–145)

## 2024-03-10 LAB — HCG, QUANTITATIVE, PREGNANCY: hCG, Beta Chain, Quant, S: 185 m[IU]/mL — ABNORMAL HIGH (ref <5)

## 2024-03-10 MED ORDER — METHYLERGONOVINE MALEATE 0.2 MG PO TABS
0.2000 mg | ORAL_TABLET | Freq: Four times a day (QID) | ORAL | Status: DC
  Administered 2024-03-11: 0.2 mg via ORAL
  Filled 2024-03-10: qty 1

## 2024-03-10 MED ORDER — METHYLERGONOVINE MALEATE 0.2 MG PO TABS: 0.2000 mg | ORAL_TABLET | Freq: Four times a day (QID) | ORAL | 0 refills | Status: DC

## 2024-03-10 MED ORDER — OXYCODONE-ACETAMINOPHEN 5-325 MG PO TABS: 1.0000 | ORAL_TABLET | Freq: Four times a day (QID) | ORAL | 0 refills | Status: DC | PRN

## 2024-03-10 NOTE — Discharge Instructions (Signed)
 Please take Methergine  as prescribed.  Please make sure to follow-up with OB/GYN for further management of your symptoms.  They will give you several doses of Percocet for pain.

## 2024-03-10 NOTE — ED Provider Notes (Signed)
.-----------------------------------------   11:30 PM on 03/10/2024 -----------------------------------------  Blood pressure 115/80, pulse 70, temperature 99.3 F (37.4 C), temperature source Oral, resp. rate 17, height 5' 4 (1.626 m), weight 81.2 kg, last menstrual period 12/11/2023, SpO2 95%.  Assuming care from Dr. Dorothyann.  In short, Sherry Villarreal is a 38 y.o. female with a chief complaint of Abortion Complications .  Refer to the original H&P for additional details.  The current plan of care is to follow with OB.  OB came down to evaluate the patient, states that they will do a speculum exam and send a wet prep, will give her a dose of Methergine  here and send her home with Methergine  outpatient.  They will also send her home with several doses of Percocet and follow-up with her outpatient.  Patient will need a work note.  Will write her for discharge after her speculum exam and Methergine  dose.         Waymond Lorelle Cummins, MD 03/10/24 [phone]

## 2024-03-10 NOTE — ED Provider Notes (Signed)
 [facility] Provider Note    Event Date/Time   First MD Initiated Contact with Patient 03/10/24 1954     (approximate)  History   Chief Complaint: Abortion Complications  HPI  Lachanda Buczek is a 38 y.o. female with a past medical history of asthma, endometriosis, presents to the emergency department over OB concerns.  According to the patient she had a positive pregnancy test at the end of August at approximately 9 weeks patient had a procedure to terminate the pregnancy 9/3.  She went back to the [facility] office 9/15 and they stated that her beta-hCG was still positive and that they saw something on the ultrasound and gave the patient intravaginal pills (likely Cytotec).  They expressed to the patient that she should have bleeding and cramping but the patient has not had any bleeding or cramping.  Patient states she has had some slight amount of vaginal discharge.  Patient states she has a follow-up appointment tomorrow with the OB but wanted to come to the emergency department to get a different opinion of what is going on.  Physical Exam   Triage Vital Signs: ED Triage Vitals  Encounter Vitals Group     BP 03/10/24 1840 115/80     Girls Systolic BP Percentile --      Girls Diastolic BP Percentile --      Boys Systolic BP Percentile --      Boys Diastolic BP Percentile --      Pulse Rate 03/10/24 1840 70     Resp 03/10/24 1840 17     Temp 03/10/24 1836 99.3 F (37.4 C)     Temp Source 03/10/24 1836 Oral     SpO2 03/10/24 1840 95 %     Weight 03/10/24 1841 179 lb (81.2 kg)     Height 03/10/24 1841 5' 4 (1.626 m)     Head Circumference --      Peak Flow --      Pain Score 03/10/24 1840 4     Pain Loc --      Pain Education --      Exclude from Growth Chart --     Most recent vital signs: Vitals:   03/10/24 1836 03/10/24 1840  BP:  115/80  Pulse:  70  Resp:  17  Temp: 99.3 F (37.4 C)   SpO2:  95%    General: Awake, no distress.  CV:  Good  peripheral perfusion.  Regular rate and rhythm  Resp:  Normal effort.  Equal breath sounds bilaterally.  Abd:  No distention.  Soft, nontender.  Benign abdomen.  ED Results / Procedures / Treatments   MEDICATIONS ORDERED IN ED: Medications - No data to display   IMPRESSION / MDM / ASSESSMENT AND PLAN / ED COURSE  I reviewed the triage vital signs and the nursing notes.  Patient's presentation is most consistent with acute presentation with potential threat to life or bodily function.  Patient presents to the emergency department for concerns over a recent pregnancy.  We do not have access to the notes from that clinic that the patient has been seeing.  Presumably she had a termination procedure 9/3 possibly a D&C and then was prescribed intravaginal pills on 9/15, presumably Cytotec.  We will recheck labs today including a quantitative beta-hCG.  Will obtain an ultrasound to further evaluate.  Patient also states over the last 2 days she has had nasal congestion and a scratchy throat.  Patient is requesting a  COVID test as a precaution.  We will do this as well.  Respiratory panel is negative.  Lab work shows a reassuring CBC reassuring chemistry beta-hCG remains positive very low at 185 decreased from greater than 250,000 last month.  Ultrasound has resulted unfortunately showing what appears to be retained products of conception.  Will discuss with OB/GYN for further recommendations.  I spoke with Dr. Beverli Schermerhorn she is reviewing the patient's chart and will have the midwife come evaluate the patient to help decide upon a treatment plan.  FINAL CLINICAL IMPRESSION(S) / ED DIAGNOSES   Retained products of conception   Note:  This document was prepared using Dragon voice recognition software and may include unintentional dictation errors.   Dorothyann Drivers, MD 03/10/24 2257

## 2024-03-10 NOTE — ED Triage Notes (Addendum)
 Pt sts that she had her pregnancy aborted on 09/03. Pt then sts that she went back to them on 09/15 for a follow up and they said pt HCG was still positive and that they saw something on the US  so they provided her with pills. Pt sts that she is not having any bleeding or blood clots since taking the medication vaginally. Pt sts that she is just having grey- ish discharge.

## 2024-03-10 NOTE — Consult Note (Cosign Needed Addendum)
 Consult History and Physical   SERVICE: Gynecology    Patient Name: Sherry Villarreal Patient MRN:   [id number]  CC: abnormal vaginal discharge after a D&C 02/19/24.  HPI: Sherry Villarreal is a 38 y.o. No obstetric history on file. with gray discharge and cramping after a D&C 02/19/24. She was approximately [redacted] weeks pregnant and had a surgical termination. She followed-up with the clinic that performed the [facility] and was given 800mcg vaginal cytotec for retained products of conception on 03/02/24. She reports she took the medication as prescribed on 03/08/24 and repeated the dosing 4 hours later and did not experience any bleeding, only cramping. Today she noted her vaginal discharge was gray and gritty. She had another US  tonight which showed continued retained products of conception. Her quant. Beta hCG today is 185. She denies any pain or odors or bleeding.  Review of Systems: positives in bold GEN:   fevers, chills, weight changes, appetite changes, fatigue, night sweats HEENT:  HA, vision changes, hearing loss, congestion, rhinorrhea, sinus pressure, dysphagia CV:   CP, palpitations PULM:  SOB, cough GI:  abd pain, N/V/D/C GU:  dysuria, urgency, frequency MSK:  arthralgias, myalgias, back pain, swelling SKIN:  rashes, color changes, pallor NEURO:  numbness, weakness, tingling, seizures, dizziness, tremors PSYCH:  depression, anxiety, behavioral problems, confusion  HEME/LYMPH:  easy bruising or bleeding ENDO:  heat/cold intolerance  Past Obstetrical History: OB History   No obstetric history on file.     Past Gynecologic History: Patient's last menstrual period was 12/11/2023.   Past Medical History: Past Medical History:  Diagnosis Date   Asthma    Endometriosis    Psoriasis    Rheumatoid arthritis (HCC)     Past Surgical History:   Past Surgical History:  Procedure Laterality Date   ABDOMINAL SURGERY     DILATION AND CURETTAGE OF UTERUS     NO PAST SURGERIES      Family  History:  family history includes Asthma in her mother; Healthy in her father.  Social History:  Social History   Socioeconomic History   Marital status: Single    Spouse name: Not on file   Number of children: Not on file   Years of education: Not on file   Highest education level: Not on file  Occupational History   Not on file  Tobacco Use   Smoking status: Never   Smokeless tobacco: Never  Vaping Use   Vaping status: Never Used  Substance and Sexual Activity   Alcohol use: Not Currently   Drug use: Not Currently   Sexual activity: Not on file  Other Topics Concern   Not on file  Social History Narrative   Not on file   Social Drivers of Health   Financial Resource Strain: Low Risk  (11/06/2019)   Received from [facility]   Overall Financial Resource Strain (CARDIA)    Difficulty of Paying Living Expenses: Not very hard  Food Insecurity: No Food Insecurity (11/06/2019)   Received from [facility]   Hunger Vital Sign    Within the past 12 months, you worried that your food would run out before you got the money to buy more.: Never true    Within the past 12 months, the food you bought just didn't last and you didn't have money to get more.: Never true  Transportation Needs: No Transportation Needs (11/06/2019)   Received from [facility] - Transportation    In  the past 12 months, has lack of transportation kept you from medical appointments or from getting medications?: No    Lack of Transportation (Non-Medical): No  Physical Activity: Inactive (11/06/2019)   Received from [facility]   Exercise Vital Sign    On average, how many days per week do you engage in moderate to strenuous exercise (like a brisk walk)?: 0 days    On average, how many minutes do you engage in exercise at this level?: 0 min  Stress: Stress Concern Present (11/06/2019)   Received from [facility] - Occupational Stress Questionnaire    Feeling of Stress : Very much  Social Connections: Socially Isolated (11/06/2019)   Received from [facility]   Social Connection and Isolation Panel    In a typical week, how many times do you talk on the phone with family, friends, or neighbors?: Twice a week    How often do you get together with friends or relatives?: Once a week    How often do you attend church or religious services?: Never    Do you belong to any clubs or organizations such as church groups, unions, fraternal or athletic groups, or school groups?: No    How often do you attend meetings of the clubs or organizations you belong to?: Never    Are you married, widowed, divorced, separated, never married, or living with a partner?: Divorced  Intimate Partner Violence: Not on file    Home Medications:  Medications reconciled in EPIC  No current facility-administered medications on file prior to encounter.   Current Outpatient Medications on File Prior to Encounter  Medication Sig Dispense Refill   albuterol  (VENTOLIN  HFA) 108 (90 Base) MCG/ACT inhaler Inhale 1-2 puffs into the lungs every 6 (six) hours as needed for wheezing or shortness of breath. 18 g 0   azelastine  (ASTELIN ) 0.1 % nasal spray Place 2 sprays into both nostrils 2 (two) times daily. 30 mL 0   clobetasol ointment (TEMOVATE) 0.05 % clobetasol 0.05 % topical ointment     Clocortolone Pivalate (CLODERM) 0.1 % cream Cloderm 0.1 % topical cream     Desoximetasone (TOPICORT) 0.25 % LIQD Topicort 0.25 % topical spray     Doxylamine -Pyridoxine  (DICLEGIS ) 10-10 MG TBEC Take 2 tablets by mouth at bedtime. 60 tablet 0   famotidine (PEPCID) 40 MG tablet famotidine 40 mg tablet     fluticasone  (FLONASE ) 50 MCG/ACT nasal spray Place 2 sprays into both nostrils daily. 16 g 0   Fluticasone  Propionate, Inhal, (FLOVENT  DISKUS) 250 MCG/BLIST AEPB Inhale into the lungs.      folic acid (FOLVITE) 1 MG tablet Take 1 mg by mouth daily.     gentamicin  cream (GARAMYCIN ) 0.1 % Apply 1 application topically 2 (two) times daily. 15 g 1   lidocaine  (XYLOCAINE ) 2 % solution 5-15 mL gurgle as needed 150 mL 0   methotrexate (RHEUMATREX) 2.5 MG tablet methotrexate sodium 2.5 mg tablet     metoCLOPramide  (REGLAN ) 10 MG tablet Take 1 tablet (10 mg total) by mouth 3 (three) times daily with meals for 15 days. 45 tablet 0   metroNIDAZOLE  (METROGEL ) 0.75 % vaginal gel Place 1 Applicatorful vaginally at bedtime. 70 g 0   montelukast  (SINGULAIR ) 10 MG tablet Take 1 tablet (10 mg total) by mouth at bedtime. 30 tablet 1   norethindrone (AYGESTIN) 5 MG tablet Take 5 mg by mouth daily.  Prenatal Vit-Fe Fumarate-FA (PRENATAL VITAMIN) 27-0.8 MG TABS Take 1 tablet by mouth daily. 90 tablet 0   Spacer/Aero-Holding Chambers (AEROCHAMBER PLUS) inhaler Use as instructed 1 each 2   [DISCONTINUED] sulfaSALAzine (AZULFIDINE) 500 MG tablet sulfasalazine 500 mg tablet  Take 1 tablet twice a day by oral route for 30 days.      Allergies:  Allergies  Allergen Reactions   Penicillins Other (See Comments) and Rash    Sores on scalp     Physical Exam:  Temp:  [99.3 F (37.4 C)] 99.3 F (37.4 C) (09/23 1836) Pulse Rate:  [70] 70 (09/23 1840) Resp:  [17] 17 (09/23 1840) BP: (115)/(80) 115/80 (09/23 1840) SpO2:  [95 %] 95 % (09/23 1840) Weight:  [81.2 kg] 81.2 kg (09/23 1841)   General Appearance:  Well developed, well nourished, no acute distress, alert and oriented x3 HEENT:  Normocephalic atraumatic, extraocular movements intact, moist mucous membranes Cardiovascular:  regular rate and rhythm Pulmonary:  clear to auscultation, no wheezes, rales or rhonchi, symmetric air entry, good air exchange Abdomen:  soft, nontender, nondistended, no abnormal masses, no epigastric pain Extremities:  Full range of motion, Skin:  normal coloration and turgor, no rashes, no suspicious skin lesions  noted  Neurologic:  normal muscle tone, strength 5/5 all four extremities Psychiatric:  Normal mood and affect, appropriate, no AH/VH Pelvic:  copious amounts of gray/brown discharge present with gritty consistency and old brown blood present, cervix appears closed and thick    Labs/Studies:   CBC and Coags:  Lab Results  Component Value Date   WBC 8.2 03/10/2024   NEUTOPHILPCT 48 12/06/2021   EOSPCT 1 12/06/2021   BASOPCT 1 12/06/2021   LYMPHOPCT 43 12/06/2021   HGB 12.0 03/10/2024   HCT 36.6 03/10/2024   MCV 91.3 03/10/2024   PLT 336 03/10/2024   CMP:  Lab Results  Component Value Date   NA 135 03/10/2024   K 3.4 (L) 03/10/2024   CL 104 03/10/2024   CO2 24 03/10/2024   BUN 8 03/10/2024   CREATININE 0.80 03/10/2024   CREATININE 0.54 02/11/2024   CREATININE 0.55 01/11/2022   PROT 6.9 02/11/2024   BILITOT 0.6 02/11/2024   ALT 10 02/11/2024   AST 18 02/11/2024   ALKPHOS 39 02/11/2024   Other Labs: Wet prep GC/CT  Other Imaging: US  OB LESS THAN 14 WEEKS WITH OB TRANSVAGINAL Result Date: 03/10/2024 CLINICAL DATA:  02379 Bleeding 02379 possible retained product of conception. D and C 02/19/2024. Nine week pregnancy 02/10/2024 EXAM: OBSTETRIC <14 WK US  AND TRANSVAGINAL OB US  TECHNIQUE: Both transabdominal and transvaginal ultrasound examinations were performed for complete evaluation of the gestation as well as the maternal uterus, adnexal regions, and pelvic cul-de-sac. Transvaginal technique was performed to assess early pregnancy. COMPARISON:  Ultrasound Ob 02/11/2024 FINDINGS: Intrauterine gestational sac: None Yolk sac:  Not Visualized. Embryo:  Not Visualized. Cardiac Activity: Not Visualized. Maternal uterus/adnexae: Globular uterus. Thickened vascular and heterogeneous endometrium measuring up to 3 cm. Bilateral ovaries are unremarkable. Bilateral adnexa are unremarkable. IMPRESSION: Findings consistent with retained products of conception. Recommend gynecologic  consultation. Electronically Signed   By: Morgane  Naveau M.D.   On: 03/10/2024 21:54   US  OB LESS THAN 14 WEEKS WITH OB TRANSVAGINAL Result Date: 02/11/2024 CLINICAL DATA:  Left lower quadrant pain x2 weeks. EXAM: OBSTETRIC <14 WK US  AND TRANSVAGINAL OB US  TECHNIQUE: Both transabdominal and transvaginal ultrasound examinations were performed for complete evaluation of the gestation as well as the maternal uterus, adnexal regions,  and pelvic cul-de-sac. Transvaginal technique was performed to assess early pregnancy. COMPARISON:  None Available. FINDINGS: Intrauterine gestational sac: Single Yolk sac:  Visualized. Embryo:  Visualized. Cardiac Activity: Visualized. Heart Rate: 171 bpm CRL:  22.6 mm   9 w   0 d                  US  EDC: September 15, 2024 Subchorionic hemorrhage:  None visualized. Maternal uterus/adnexae: The right ovary is not visualized. The left ovary measures 3.6 cm x 2.1 cm x 1.6 cm and contains a 2.5 cm x 1.6 cm x 1.2 cm hypoechoic area. IMPRESSION: 1. Single, viable intrauterine pregnancy at approximately 9 weeks and 0 days gestation by ultrasound evaluation. 2. Hypoechoic area within the left ovary that may represent a complex ovarian cyst. Correlation with 6-12 week follow-up pelvic ultrasound is recommended to determine stability. Electronically Signed   By: Suzen Dials M.D.   On: 02/11/2024 22:35     Assessment / Plan:   Sherry Villarreal is a 38 y.o. No obstetric history on file. who presents with retained products of conception and abnormal vaginal discharge after a D&C for a surgical termination.  1. Retained products of conception: her beta hCG is 185 but there is a 3cm thickened vascular and heterogenous endometrial area present suggestive of retained products. She has already used cytotec 800mcg vaginally x 2 without any bleeding. Will try Methergine  0.2mg  PO q6hrs x 24hrs. #6 Percocet 5-325mg  sent to her pharmacy for pain control if needed. Will follow-up in our office  outpatient to ensure products of conception have been passed. 2. Abnormal vaginal discharge: appears gritty and discolored, likely from the cytotec dissolving in the vagina. Wet prep and GC/CT collected to r/o infections. Serious infections unlikely d/t normal WBC, afebrile, and lack of cervical/uterine tenderness. Treat any positive infections.  3. BV: BV present on wet prep. Treat with Metronidazole  500mg  BID x 7 days.  Thank you for the opportunity to be involved with this pt's care.   Edsel Charlies Blush, CNM 03/11/2024 12:44 AM

## 2024-03-11 LAB — CHLAMYDIA/NGC RT PCR (ARMC ONLY)
Chlamydia Tr: NOT DETECTED
N gonorrhoeae: NOT DETECTED

## 2024-03-11 LAB — WET PREP, GENITAL
Sperm: NONE SEEN
Trich, Wet Prep: NONE SEEN
WBC, Wet Prep HPF POC: >=10 — AB (ref <10)
Yeast Wet Prep HPF POC: NONE SEEN

## 2024-03-11 MED ORDER — METHYLERGONOVINE MALEATE 0.2 MG PO TABS: 0.2000 mg | ORAL_TABLET | Freq: Four times a day (QID) | ORAL | 0 refills | Status: AC

## 2024-03-11 MED ORDER — METRONIDAZOLE 500 MG PO TABS: 500.0000 mg | ORAL_TABLET | Freq: Two times a day (BID) | ORAL | 0 refills | Status: AC

## 2024-03-11 MED ORDER — OXYCODONE-ACETAMINOPHEN 5-325 MG PO TABS
1.0000 | ORAL_TABLET | Freq: Four times a day (QID) | ORAL | Status: DC | PRN
  Administered 2024-03-11: 1 via ORAL
  Filled 2024-03-11: qty 1

## 2024-03-11 MED ORDER — METRONIDAZOLE 500 MG PO TABS
500.0000 mg | ORAL_TABLET | Freq: Two times a day (BID) | ORAL | Status: DC
  Administered 2024-03-11: 500 mg via ORAL
  Filled 2024-03-11: qty 1

## 2024-03-11 MED ORDER — OXYCODONE-ACETAMINOPHEN 5-325 MG PO TABS: 1.0000 | ORAL_TABLET | Freq: Four times a day (QID) | ORAL | 0 refills | Status: DC | PRN

## 2024-03-11 MED ORDER — METRONIDAZOLE 500 MG PO TABS: 500.0000 mg | ORAL_TABLET | Freq: Two times a day (BID) | ORAL | 0 refills | Status: DC
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
## 2024-03-23 ENCOUNTER — Ambulatory Visit

## 2024-03-23 ENCOUNTER — Other Ambulatory Visit: Payer: Self-pay

## 2024-03-23 ENCOUNTER — Encounter: Payer: Self-pay | Admitting: Obstetrics and Gynecology

## 2024-03-23 ENCOUNTER — Encounter
Admission: RE | Disposition: A | Discharge status: Home / Self Care | Payer: Self-pay | Source: Home / Self Care | Attending: Obstetrics and Gynecology

## 2024-03-23 ENCOUNTER — Ambulatory Visit
Admission: RE | Admit: 2024-03-23 | Discharge: 2024-03-23 | Disposition: A | Discharge status: Home / Self Care | Attending: Obstetrics and Gynecology | Admitting: Obstetrics and Gynecology

## 2024-03-23 DIAGNOSIS — J45909 Unspecified asthma, uncomplicated: Secondary | ICD-10-CM | POA: Diagnosis not present

## 2024-03-23 DIAGNOSIS — O021 Missed abortion: Secondary | ICD-10-CM

## 2024-03-23 DIAGNOSIS — Z7951 Long term (current) use of inhaled steroids: Secondary | ICD-10-CM | POA: Diagnosis not present

## 2024-03-23 DIAGNOSIS — N858 Other specified noninflammatory disorders of uterus: Secondary | ICD-10-CM | POA: Insufficient documentation

## 2024-03-23 DIAGNOSIS — Z79899 Other long term (current) drug therapy: Secondary | ICD-10-CM | POA: Insufficient documentation

## 2024-03-23 DIAGNOSIS — O034 Incomplete spontaneous abortion without complication: Secondary | ICD-10-CM | POA: Diagnosis present

## 2024-03-23 HISTORY — PX: MYOSURE RESECTION: SHX7611

## 2024-03-23 HISTORY — PX: DILATATION & CURRETTAGE/HYSTEROSCOPY WITH RESECTOCOPE: SHX5572

## 2024-03-23 LAB — CBC
HCT: 35.4 % — ABNORMAL LOW (ref 36.0–46.0)
Hemoglobin: 11.3 g/dL — ABNORMAL LOW (ref 12.0–15.0)
MCH: 29.2 pg (ref 26.0–34.0)
MCHC: 31.9 g/dL (ref 30.0–36.0)
MCV: 91.5 fL (ref 80.0–100.0)
Platelets: 283 K/uL (ref 150–400)
RBC: 3.87 MIL/uL (ref 3.87–5.11)
RDW: 13.2 % (ref 11.5–15.5)
WBC: 8.4 K/uL (ref 4.0–10.5)
nRBC: 0 % (ref 0.0–0.2)

## 2024-03-23 LAB — ABO/RH: ABO/RH(D): A POS

## 2024-03-23 LAB — TYPE AND SCREEN
ABO/RH(D): A POS
Antibody Screen: NEGATIVE

## 2024-03-23 SURGERY — DILATATION & CURETTAGE/HYSTEROSCOPY WITH RESECTOCOPE
Anesthesia: General | Site: Cervix

## 2024-03-23 MED ORDER — LACTATED RINGERS IV SOLN: INTRAVENOUS | Status: DC

## 2024-03-23 MED ORDER — ONDANSETRON HCL 4 MG/2ML IJ SOLN
INTRAMUSCULAR | Status: AC
  Filled 2024-03-23: qty 2

## 2024-03-23 MED ORDER — VASOPRESSIN 20 UNIT/ML IV SOLN
INTRAVENOUS | Status: AC
  Filled 2024-03-23: qty 1

## 2024-03-23 MED ORDER — DOXYCYCLINE HYCLATE 100 MG PO TABS
100.0000 mg | ORAL_TABLET | Freq: Once | ORAL | Status: AC
  Administered 2024-03-23: 100 mg via ORAL
  Filled 2024-03-23: qty 1

## 2024-03-23 MED ORDER — MIDAZOLAM HCL 2 MG/2ML IJ SOLN
INTRAMUSCULAR | Status: DC | PRN
  Administered 2024-03-23: 2 mg via INTRAVENOUS

## 2024-03-23 MED ORDER — PROPOFOL 10 MG/ML IV BOLUS
INTRAVENOUS | Status: AC
  Filled 2024-03-23: qty 20

## 2024-03-23 MED ORDER — PROPOFOL 10 MG/ML IV BOLUS
INTRAVENOUS | Status: DC | PRN
  Administered 2024-03-23: 200 mg via INTRAVENOUS

## 2024-03-23 MED ORDER — GLYCOPYRROLATE 0.2 MG/ML IJ SOLN
INTRAMUSCULAR | Status: DC | PRN
  Administered 2024-03-23: .2 mg via INTRAVENOUS

## 2024-03-23 MED ORDER — OXYCODONE HCL 5 MG PO TABS
5.0000 mg | ORAL_TABLET | Freq: Once | ORAL | Status: AC | PRN
  Administered 2024-03-23: 5 mg via ORAL

## 2024-03-23 MED ORDER — SODIUM CHLORIDE (PF) 0.9 % IJ SOLN
INTRAMUSCULAR | Status: AC
  Filled 2024-03-23: qty 50

## 2024-03-23 MED ORDER — ACETAMINOPHEN 500 MG PO TABS
1000.0000 mg | ORAL_TABLET | ORAL | Status: AC
  Administered 2024-03-23: 1000 mg via ORAL

## 2024-03-23 MED ORDER — ONDANSETRON HCL 4 MG/2ML IJ SOLN
INTRAMUSCULAR | Status: DC | PRN
  Administered 2024-03-23: 4 mg via INTRAVENOUS

## 2024-03-23 MED ORDER — LIDOCAINE HCL (CARDIAC) PF 100 MG/5ML IV SOSY
PREFILLED_SYRINGE | INTRAVENOUS | Status: DC | PRN
  Administered 2024-03-23: 100 mg via INTRAVENOUS

## 2024-03-23 MED ORDER — OXYCODONE HCL 5 MG PO TABS
ORAL_TABLET | ORAL | Status: AC
  Filled 2024-03-23: qty 1

## 2024-03-23 MED ORDER — PHENYLEPHRINE 80 MCG/ML (10ML) SYRINGE FOR IV PUSH (FOR BLOOD PRESSURE SUPPORT)
PREFILLED_SYRINGE | INTRAVENOUS | Status: AC
  Filled 2024-03-23: qty 10

## 2024-03-23 MED ORDER — TRANEXAMIC ACID-NACL 1000-0.7 MG/100ML-% IV SOLN
INTRAVENOUS | Status: AC
  Filled 2024-03-23: qty 100

## 2024-03-23 MED ORDER — FENTANYL CITRATE (PF) 100 MCG/2ML IJ SOLN
25.0000 ug | INTRAMUSCULAR | Status: AC | PRN
  Administered 2024-03-23: 50 ug via INTRAVENOUS
  Administered 2024-03-23 (×2): 25 ug via INTRAVENOUS
  Administered 2024-03-23: 50 ug via INTRAVENOUS
  Administered 2024-03-23 (×2): 25 ug via INTRAVENOUS

## 2024-03-23 MED ORDER — ORAL CARE MOUTH RINSE: 15.0000 mL | Freq: Once | OROMUCOSAL | Status: AC

## 2024-03-23 MED ORDER — ACETAMINOPHEN 500 MG PO TABS
ORAL_TABLET | ORAL | Status: AC
Start: 2024-03-23 — End: 2024-03-23
  Filled 2024-03-23: qty 2

## 2024-03-23 MED ORDER — CHLORHEXIDINE GLUCONATE 0.12 % MT SOLN
15.0000 mL | Freq: Once | OROMUCOSAL | Status: AC
  Administered 2024-03-23: 15 mL via OROMUCOSAL

## 2024-03-23 MED ORDER — FENTANYL CITRATE (PF) 100 MCG/2ML IJ SOLN
INTRAMUSCULAR | Status: AC
  Filled 2024-03-23: qty 2

## 2024-03-23 MED ORDER — CHLORHEXIDINE GLUCONATE 0.12 % MT SOLN
OROMUCOSAL | Status: AC
  Filled 2024-03-23: qty 15

## 2024-03-23 MED ORDER — METHYLERGONOVINE MALEATE 0.2 MG/ML IJ SOLN
INTRAMUSCULAR | Status: AC
Start: 2024-03-23 — End: 2024-03-23
  Filled 2024-03-23: qty 1

## 2024-03-23 MED ORDER — ESTROGENS CONJUGATED 0.625 MG/GM VA CREA
TOPICAL_CREAM | VAGINAL | Status: AC
  Filled 2024-03-23: qty 30

## 2024-03-23 MED ORDER — LIDOCAINE HCL (PF) 2 % IJ SOLN
INTRAMUSCULAR | Status: AC
  Filled 2024-03-23: qty 5

## 2024-03-23 MED ORDER — CELECOXIB 200 MG PO CAPS
ORAL_CAPSULE | ORAL | Status: AC
  Filled 2024-03-23: qty 2

## 2024-03-23 MED ORDER — FENTANYL CITRATE (PF) 100 MCG/2ML IJ SOLN
INTRAMUSCULAR | Status: DC | PRN
  Administered 2024-03-23 (×2): 50 ug via INTRAVENOUS

## 2024-03-23 MED ORDER — DEXAMETHASONE SODIUM PHOSPHATE 10 MG/ML IJ SOLN
INTRAMUSCULAR | Status: AC
  Filled 2024-03-23: qty 1

## 2024-03-23 MED ORDER — POVIDONE-IODINE 10 % EX SWAB: 2.0000 | Freq: Once | CUTANEOUS | Status: DC

## 2024-03-23 MED ORDER — IBUPROFEN 800 MG PO TABS: 800.0000 mg | ORAL_TABLET | Freq: Three times a day (TID) | ORAL | 1 refills | Status: AC

## 2024-03-23 MED ORDER — MIDAZOLAM HCL 2 MG/2ML IJ SOLN
INTRAMUSCULAR | Status: AC
  Filled 2024-03-23: qty 2

## 2024-03-23 MED ORDER — ONDANSETRON 4 MG PO TBDP: 4.0000 mg | ORAL_TABLET | Freq: Three times a day (TID) | ORAL | 1 refills | Status: AC | PRN

## 2024-03-23 MED ORDER — CELECOXIB 200 MG PO CAPS
400.0000 mg | ORAL_CAPSULE | ORAL | Status: AC
  Administered 2024-03-23: 400 mg via ORAL

## 2024-03-23 MED ORDER — OXYCODONE HCL 5 MG/5ML PO SOLN: 5.0000 mg | Freq: Once | ORAL | Status: AC | PRN

## 2024-03-23 MED ORDER — 0.9 % SODIUM CHLORIDE (POUR BTL) OPTIME
TOPICAL | Status: DC | PRN
  Administered 2024-03-23: 500 mL

## 2024-03-23 MED ORDER — DOXYCYCLINE HYCLATE 100 MG PO TABS
200.0000 mg | ORAL_TABLET | Freq: Once | ORAL | Status: DC
  Filled 2024-03-23: qty 2

## 2024-03-23 MED ORDER — EPHEDRINE SULFATE-NACL 50-0.9 MG/10ML-% IV SOSY
PREFILLED_SYRINGE | INTRAVENOUS | Status: DC | PRN
  Administered 2024-03-23: 5 mg via INTRAVENOUS

## 2024-03-23 MED ORDER — DEXAMETHASONE SODIUM PHOSPHATE 10 MG/ML IJ SOLN
INTRAMUSCULAR | Status: DC | PRN
  Administered 2024-03-23: 10 mg via INTRAVENOUS

## 2024-03-23 MED ORDER — DEXMEDETOMIDINE HCL IN NACL 80 MCG/20ML IV SOLN
INTRAVENOUS | Status: DC | PRN
  Administered 2024-03-23: 8 ug via INTRAVENOUS

## 2024-03-23 SURGICAL SUPPLY — 23 items
BAG PRESSURE INF REUSE 1000 (BAG) ×1 IMPLANT
BASIN KIT SINGLE STR (MISCELLANEOUS) ×1 IMPLANT
CATH FOL 2WAY LX 16X30 (CATHETERS) IMPLANT
DEVICE MYOSURE LITE (MISCELLANEOUS) IMPLANT
DEVICE MYOSURE REACH (MISCELLANEOUS) IMPLANT
DRSG TELFA 3X8 NADH STRL (GAUZE/BANDAGES/DRESSINGS) IMPLANT
ELECTRODE REM PT RTRN 9FT ADLT (ELECTROSURGICAL) ×1 IMPLANT
GLOVE BIO SURGEON STRL SZ7 (GLOVE) ×1 IMPLANT
GLOVE INDICATOR 7.5 STRL GRN (GLOVE) ×1 IMPLANT
GOWN STRL REUS W/ TWL LRG LVL3 (GOWN DISPOSABLE) ×2 IMPLANT
KIT PROCED FLUENT PRO FLT212S (KITS) ×1 IMPLANT
KIT TURNOVER CYSTO (KITS) ×1 IMPLANT
MANIFOLD NEPTUNE II (INSTRUMENTS) ×1 IMPLANT
PACK DNC HYST (MISCELLANEOUS) ×1 IMPLANT
PAD PREP OB/GYN DISP 24X41 (PERSONAL CARE ITEMS) ×1 IMPLANT
SCRUB CHG 4% DYNA-HEX 4OZ (MISCELLANEOUS) ×1 IMPLANT
SEAL ROD LENS SCOPE MYOSURE (ABLATOR) IMPLANT
SET CYSTO W/LG BORE CLAMP LF (SET/KITS/TRAYS/PACK) IMPLANT
SOL .9 NS 3000ML IRR UROMATIC (IV SOLUTION) ×1 IMPLANT
SYR 50ML LL SCALE MARK (SYRINGE) IMPLANT
TRAP FLUID SMOKE EVACUATOR (MISCELLANEOUS) ×1 IMPLANT
TUBING CONNECTING 10 (TUBING) ×1 IMPLANT
WATER STERILE IRR 500ML POUR (IV SOLUTION) ×1 IMPLANT

## 2024-03-23 NOTE — Transfer of Care (Signed)
 Immediate Anesthesia Transfer of Care Note  Patient: Sherry Villarreal  Procedure(s) Performed: DILATATION & CURETTAGE/HYSTEROSCOPY WITH RESECTOCOPE (Cervix) MYOSURE RESECTION (Cervix)  Patient Location: PACU  Anesthesia Type:General  Level of Consciousness: awake, alert , oriented, and patient cooperative  Airway & Oxygen Therapy: Patient Spontanous Breathing and Patient connected to face mask oxygen  Post-op Assessment: Report given to RN and Post -op Vital signs reviewed and stable  Post vital signs: Reviewed and stable  Last Vitals:  Vitals Value Taken Time  BP 108/91 03/23/24 13:15  Temp    Pulse 90 03/23/24 13:15  Resp 20 03/23/24 13:15  SpO2 91 % 03/23/24 13:15    Last Pain:  Vitals:   03/23/24 1035  TempSrc: Temporal  PainSc: 0-No pain         Complications: No notable events documented.
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
## 2024-03-23 NOTE — Discharge Instructions (Signed)
 Discharge instructions after a hysteroscopy with dilation and curettage  Signs and Symptoms to Report  Call our office at [phone] if you have any of the following:    Fever over 100.4 degrees or higher  Severe stomach pain not relieved with pain medications  Bright red bleeding that's heavier than a period that does not slow with rest after the first 48 hours  To go the bathroom a lot (frequency), you can't hold your urine (urgency), or it hurts when you empty your bladder (urinate)  Chest pain  Shortness of breath  Pain in the calves of your legs  Severe nausea and vomiting not relieved with anti-nausea medications  Any concerns  What You Can Expect after Surgery  You may see some pink tinged, bloody fluid. This is normal. You may also have cramping for several days.   Activities after Your Discharge Follow these guidelines to help speed your recovery at home:  Don't drive if you are in pain or taking narcotic pain medicine. You may drive when you can safely slam on the brakes, turn the wheel forcefully, and rotate your torso comfortably. This is typically 4-7 days. Practice in a parking lot or side street prior to attempting to drive regularly.   Ask others to help with household chores for 4 weeks.  Don't do strenuous activities, exercises, or sports like vacuuming, tennis, squash, etc. until your doctor says it is safe to do so.  Walk as you feel able. Rest often since it may take a week or two for your energy level to return to normal.   You may climb stairs  Avoid constipation:   -Eat fruits, vegetables, and whole grains. Eat small meals as your appetite will take time to return to normal.   -Drink 6 to 8 glasses of water each day unless your doctor has told you to limit your fluids.   -Use a laxative or stool softener as needed if constipation becomes a problem. You may take Miralax, metamucil, Citrucil, Colace, Senekot, FiberCon, etc. If this does not relieve the  constipation, try two tablespoons of Milk Of Magnesia every 8 hours until your bowels move.   You may shower.   Do not get in a hot tub, swimming pool, etc. until your doctor agrees.  Do not douche, use tampons, or have sex until your doctor says it is okay, usually about 2 weeks.  Take your pain medicine when you need it. The medicine may not work as well if the pain is bad.  Take the medicines you were taking before surgery. Other medications you might need are pain medications (ibuprofen ), medications for constipation (Colace) and nausea medications (Zofran ).

## 2024-03-23 NOTE — Anesthesia Procedure Notes (Signed)
 Procedure Name: LMA Insertion Date/Time: 03/23/2024 11:54 AM  Performed by: Dominica Krabbe, CRNAPre-anesthesia Checklist: Patient identified, Emergency Drugs available, Suction available, Patient being monitored and Timeout performed Patient Re-evaluated:Patient Re-evaluated prior to induction Oxygen Delivery Method: Circle system utilized Preoxygenation: Pre-oxygenation with 100% oxygen Induction Type: IV induction LMA Size: 4.0 Tube type: Oral Placement Confirmation: positive ETCO2 and breath sounds checked- equal and bilateral Tube secured with: Tape Dental Injury: Teeth and Oropharynx as per pre-operative assessment

## 2024-03-23 NOTE — Op Note (Signed)
 Operative Report Hysteroscopy with Dilation and Curettage   Indications: back pain  Pre-operative Diagnosis: retained products    Post-operative Diagnosis: same.  Procedure: 1. Exam under anesthesia 2. Hysteroscopy 3. D&C 4. Myosure excision  Surgeon: [name], MD  Assistant(s):  None  Anesthesia: General LMA anesthesia  Anesthesiologist: Myra Lynwood MATSU, MD Anesthesiologist: Myra Lynwood MATSU, MD; Piscitello, Fairy POUR, MD CRNA: Dominica Krabbe, CRNA  Estimated Blood Loss:  25  Total IV Fluids:  Urine Output: 10ml  Total Fluid Deficit:  250 mL          Specimens: Endometrial curettings, Myosure retained products         Complications:  None; patient tolerated the procedure well.         Disposition: PACU - hemodynamically stable.         Condition: stable  Findings: Uterus with right sided yellowed calcified tissue on the right lateral uterus. Retroverted. Normal fundus and bilateral fallopian tube ostia. Endometrial tissue was smooth and non-proliferative. The tissue was overlying active blood vessels which did bleed profusely. Normal cervix, vagina, perineum.   Indication for procedure/Consents: 38 y.o. F here for scheduled surgery for the aforementioned diagnoses.     Risks of surgery were discussed with the patient including but not limited to: bleeding which may require transfusion; infection which may require antibiotics; injury to uterus or surrounding organs; intrauterine scarring which may impair future fertility; need for additional procedures including laparotomy or laparoscopy; and other postoperative/anesthesia complications. Written informed consent was obtained.    Procedure Details:   D&C/ Myosure  The patient was taken to the operating room where anesthesia was administered and was found to be adequate.  After a formal and adequate timeout was performed, she was placed in the dorsal lithotomy position and examined with the above  findings. She was then prepped and draped in the sterile manner.   Her bladder was catheterized for an estimated amount of clear, yellow urine. A weighed speculum was then placed in the patient's vagina and a single tooth tenaculum was applied to the anterior lip of the cervix.  She received oral doxycycline prior to the surgery and in the recovery room.  Her cervix was serially dilated to 15 [place] using Hanks dilators. The hysteroscope was introduced under direct observation  Using lactated ringers as a distention medium to reveal the above findings. The uterine cavity was carefully examined, both ostia were recognized, and diffusely smooth endometrium with the old tissue noted above. This was resected using the Myosure device.  After further careful visualization of the uterine cavity, the hysteroscope was removed under direct visualization.   She did bleed significantly from this site, and the vision of the hysteroscope was obscured. A 30ml foley and estrogen was placed for a full five minutes to tamponade bleeding, which decreased the blood loss.  The tenaculum was removed from the anterior lip of the cervix and the vaginal speculum was removed after applying silver nitrate/pressure for good hemostasis.   The patient tolerated the procedure well and was taken to the recovery area awake and in stable condition. She received iv acetaminophen  prior to leaving the OR.  The patient will be discharged to home as per PACU criteria. Routine postoperative instructions given.  She was prescribed Ibuprofen  and Colace.  She will follow up in the clinic in two weeks for postoperative evaluation.

## 2024-03-23 NOTE — Interval H&P Note (Signed)
 History and Physical Interval Note:  03/23/2024 11:27 AM  Sherry Villarreal  has presented today for surgery, with the diagnosis of retained products of conception.  The various methods of treatment have been discussed with the patient and family. After consideration of risks, benefits and other options for treatment, the patient has consented to  Procedure(s) with comments: DILATATION & CURETTAGE/HYSTEROSCOPY WITH RESECTOCOPE (N/A) MYOSURE RESECTION (N/A) - REMOVAL OF PRODUCTS OF CONCEPTION as a surgical intervention.  The patient's history has been reviewed, patient examined, no change in status, stable for surgery.  I have reviewed the patient's chart and labs.  Questions were answered to the patient's satisfaction.     [name]

## 2024-03-24 ENCOUNTER — Encounter: Payer: Self-pay | Admitting: Obstetrics and Gynecology

## 2024-03-24 LAB — SURGICAL PATHOLOGY

## 2024-03-30 NOTE — Anesthesia Postprocedure Evaluation (Signed)
 Anesthesia Post Note  Patient: Sherry Villarreal  Procedure(s) Performed: DILATATION & CURETTAGE/HYSTEROSCOPY WITH RESECTOCOPE (Cervix) MYOSURE RESECTION (Cervix)  Patient location during evaluation: PACU Anesthesia Type: General Level of consciousness: awake and alert Pain management: pain level controlled Vital Signs Assessment: post-procedure vital signs reviewed and stable Respiratory status: spontaneous breathing, nonlabored ventilation, respiratory function stable and patient connected to nasal cannula oxygen Cardiovascular status: blood pressure returned to baseline and stable Postop Assessment: no apparent nausea or vomiting Anesthetic complications: no   No notable events documented.   Last Vitals:  Vitals:   03/23/24 1400 03/23/24 1409  BP: 98/63 (!) 94/56  Pulse: 75 61  Resp: 15 16  Temp:  (!) 36.3 C  SpO2: 100% 100%    Last Pain:  Vitals:   03/23/24 1409  TempSrc: Temporal  PainSc: 2                  Lynwood KANDICE Clause
